# Patient Record
Sex: Male | Born: 1950
Health system: Southern US, Community
[De-identification: ages and names within clinical notes are randomized; demographics above are authoritative.]

## PROBLEM LIST (undated history)

## (undated) DIAGNOSIS — W11XXXA Fall on and from ladder, initial encounter: Secondary | ICD-10-CM

## (undated) DIAGNOSIS — I10 Essential (primary) hypertension: Secondary | ICD-10-CM

## (undated) HISTORY — DX: Fall on and from ladder, initial encounter: W11.XXXA

---

## 1999-07-24 ENCOUNTER — Emergency Department (HOSPITAL_COMMUNITY): Admission: EM | Admit: 1999-07-24 | Discharge: 1999-07-24 | Payer: Self-pay | Admitting: Emergency Medicine

## 2005-12-25 ENCOUNTER — Ambulatory Visit: Payer: Self-pay | Admitting: Internal Medicine

## 2011-05-21 ENCOUNTER — Other Ambulatory Visit (HOSPITAL_COMMUNITY): Payer: Self-pay

## 2011-05-21 ENCOUNTER — Emergency Department (HOSPITAL_COMMUNITY): Payer: Worker's Compensation

## 2011-05-21 ENCOUNTER — Other Ambulatory Visit: Payer: Self-pay

## 2011-05-21 ENCOUNTER — Encounter (HOSPITAL_COMMUNITY): Payer: Self-pay | Admitting: *Deleted

## 2011-05-21 ENCOUNTER — Inpatient Hospital Stay (HOSPITAL_COMMUNITY)
Admission: EM | Admit: 2011-05-21 | Discharge: 2011-05-23 | DRG: 982 | Disposition: A | Discharge status: Home / Self Care | Payer: Worker's Compensation | Attending: General Surgery | Admitting: General Surgery

## 2011-05-21 DIAGNOSIS — S0181XA Laceration without foreign body of other part of head, initial encounter: Secondary | ICD-10-CM | POA: Diagnosis present

## 2011-05-21 DIAGNOSIS — S0219XA Other fracture of base of skull, initial encounter for closed fracture: Secondary | ICD-10-CM

## 2011-05-21 DIAGNOSIS — S022XXA Fracture of nasal bones, initial encounter for closed fracture: Secondary | ICD-10-CM | POA: Diagnosis present

## 2011-05-21 DIAGNOSIS — D62 Acute posthemorrhagic anemia: Secondary | ICD-10-CM | POA: Diagnosis present

## 2011-05-21 DIAGNOSIS — W19XXXA Unspecified fall, initial encounter: Secondary | ICD-10-CM

## 2011-05-21 DIAGNOSIS — S0280XA Fracture of other specified skull and facial bones, unspecified side, initial encounter for closed fracture: Secondary | ICD-10-CM | POA: Diagnosis present

## 2011-05-21 DIAGNOSIS — S020XXA Fracture of vault of skull, initial encounter for closed fracture: Secondary | ICD-10-CM | POA: Diagnosis present

## 2011-05-21 DIAGNOSIS — Y998 Other external cause status: Secondary | ICD-10-CM

## 2011-05-21 DIAGNOSIS — S065X9A Traumatic subdural hemorrhage with loss of consciousness of unspecified duration, initial encounter: Secondary | ICD-10-CM | POA: Diagnosis present

## 2011-05-21 DIAGNOSIS — S020XXB Fracture of vault of skull, initial encounter for open fracture: Principal | ICD-10-CM | POA: Diagnosis present

## 2011-05-21 DIAGNOSIS — S0285XA Fracture of orbit, unspecified, initial encounter for closed fracture: Secondary | ICD-10-CM | POA: Diagnosis present

## 2011-05-21 DIAGNOSIS — S52599A Other fractures of lower end of unspecified radius, initial encounter for closed fracture: Secondary | ICD-10-CM | POA: Diagnosis present

## 2011-05-21 DIAGNOSIS — W1789XA Other fall from one level to another, initial encounter: Secondary | ICD-10-CM | POA: Diagnosis present

## 2011-05-21 DIAGNOSIS — S52501A Unspecified fracture of the lower end of right radius, initial encounter for closed fracture: Secondary | ICD-10-CM | POA: Diagnosis present

## 2011-05-21 DIAGNOSIS — S0005XA Superficial foreign body of scalp, initial encounter: Secondary | ICD-10-CM | POA: Diagnosis present

## 2011-05-21 DIAGNOSIS — S0085XA Superficial foreign body of other part of head, initial encounter: Secondary | ICD-10-CM | POA: Diagnosis present

## 2011-05-21 DIAGNOSIS — I6200 Nontraumatic subdural hemorrhage, unspecified: Secondary | ICD-10-CM

## 2011-05-21 DIAGNOSIS — S01501A Unspecified open wound of lip, initial encounter: Secondary | ICD-10-CM | POA: Diagnosis present

## 2011-05-21 DIAGNOSIS — S0180XA Unspecified open wound of other part of head, initial encounter: Secondary | ICD-10-CM | POA: Diagnosis present

## 2011-05-21 DIAGNOSIS — S02839A Fracture of medial orbital wall, unspecified side, initial encounter for closed fracture: Secondary | ICD-10-CM

## 2011-05-21 DIAGNOSIS — W11XXXA Fall on and from ladder, initial encounter: Secondary | ICD-10-CM | POA: Diagnosis present

## 2011-05-21 HISTORY — DX: Essential (primary) hypertension: I10

## 2011-05-21 LAB — BASIC METABOLIC PANEL
BUN: 22 mg/dL (ref 6–23)
CO2: 28 mEq/L (ref 19–32)
Calcium: 9.2 mg/dL (ref 8.4–10.5)
Chloride: 100 mEq/L (ref 96–112)
Creatinine, Ser: 0.99 mg/dL (ref 0.50–1.35)
GFR calc Af Amer: >90 mL/min (ref >90)
GFR calc non Af Amer: 87 mL/min — ABNORMAL LOW (ref >90)
Glucose, Bld: 135 mg/dL — ABNORMAL HIGH (ref 70–99)
Potassium: 3.7 mEq/L (ref 3.5–5.1)
Sodium: 137 mEq/L (ref 135–145)

## 2011-05-21 LAB — ETHANOL: Alcohol, Ethyl (B): <11 mg/dL (ref 0–11)

## 2011-05-21 LAB — CBC
HCT: 42.1 % (ref 39.0–52.0)
Hemoglobin: 14.4 g/dL (ref 13.0–17.0)
MCH: 30.3 pg (ref 26.0–34.0)
MCHC: 34.2 g/dL (ref 30.0–36.0)
MCV: 88.4 fL (ref 78.0–100.0)
Platelets: 292 10*3/uL (ref 150–400)
RBC: 4.76 MIL/uL (ref 4.22–5.81)
RDW: 13.3 % (ref 11.5–15.5)
WBC: 6.4 10*3/uL (ref 4.0–10.5)

## 2011-05-21 LAB — PROTIME-INR
INR: 0.99 (ref 0.00–1.49)
Prothrombin Time: 13.3 seconds (ref 11.6–15.2)

## 2011-05-21 MED ORDER — PANTOPRAZOLE SODIUM 40 MG PO TBEC
40.0000 mg | DELAYED_RELEASE_TABLET | Freq: Every day | ORAL | Status: DC
  Administered 2011-05-21: 40 mg via ORAL
  Filled 2011-05-21: qty 1

## 2011-05-21 MED ORDER — TETANUS-DIPHTH-ACELL PERTUSSIS 5-2.5-18.5 LF-MCG/0.5 IM SUSP
0.5000 mL | Freq: Once | INTRAMUSCULAR | Status: AC
  Administered 2011-05-21: 0.5 mL via INTRAMUSCULAR
  Filled 2011-05-21: qty 0.5

## 2011-05-21 MED ORDER — ONDANSETRON HCL 4 MG PO TABS: 4.0000 mg | ORAL_TABLET | Freq: Four times a day (QID) | ORAL | Status: DC | PRN

## 2011-05-21 MED ORDER — MORPHINE SULFATE 2 MG/ML IJ SOLN
2.0000 mg | INTRAMUSCULAR | Status: DC | PRN
  Administered 2011-05-22 (×4): 2 mg via INTRAVENOUS
  Filled 2011-05-21 (×4): qty 1

## 2011-05-21 MED ORDER — PANTOPRAZOLE SODIUM 40 MG IV SOLR
40.0000 mg | Freq: Every day | INTRAVENOUS | Status: DC
  Filled 2011-05-21 (×2): qty 40

## 2011-05-21 MED ORDER — BACITRACIN-NEOMYCIN-POLYMYXIN OINTMENT TUBE
TOPICAL_OINTMENT | Freq: Every day | CUTANEOUS | Status: DC
  Administered 2011-05-21 – 2011-05-23 (×3): via TOPICAL
  Filled 2011-05-21: qty 15
  Filled 2011-05-21: qty 1

## 2011-05-21 MED ORDER — CEFAZOLIN SODIUM 1-5 GM-% IV SOLN
1.0000 g | Freq: Three times a day (TID) | INTRAVENOUS | Status: DC
  Administered 2011-05-21 – 2011-05-22 (×3): 1 g via INTRAVENOUS
  Filled 2011-05-21 (×8): qty 50

## 2011-05-21 MED ORDER — CEFAZOLIN SODIUM-DEXTROSE 2-3 GM-% IV SOLR
2.0000 g | Freq: Once | INTRAVENOUS | Status: AC
  Administered 2011-05-21: 2 g via INTRAVENOUS
  Filled 2011-05-21: qty 50

## 2011-05-21 MED ORDER — ACETAMINOPHEN 325 MG PO TABS
650.0000 mg | ORAL_TABLET | ORAL | Status: DC | PRN
  Administered 2011-05-21 – 2011-05-22 (×2): 650 mg via ORAL
  Filled 2011-05-21 (×2): qty 2

## 2011-05-21 MED ORDER — KCL IN DEXTROSE-NACL 20-5-0.9 MEQ/L-%-% IV SOLN
INTRAVENOUS | Status: DC
  Administered 2011-05-21 – 2011-05-22 (×3): via INTRAVENOUS
  Filled 2011-05-21 (×5): qty 1000

## 2011-05-21 MED ORDER — MORPHINE SULFATE 4 MG/ML IJ SOLN
4.0000 mg | Freq: Once | INTRAMUSCULAR | Status: AC
  Administered 2011-05-21: 4 mg via INTRAVENOUS

## 2011-05-21 MED ORDER — MORPHINE SULFATE 4 MG/ML IJ SOLN
INTRAMUSCULAR | Status: AC
  Administered 2011-05-21: 4 mg via INTRAVENOUS
  Filled 2011-05-21: qty 1

## 2011-05-21 MED ORDER — TETANUS-DIPHTH-ACELL PERTUSSIS 5-2-15.5 LF-MCG/0.5 IM SUSP: 0.5000 mL | Freq: Once | INTRAMUSCULAR | Status: DC

## 2011-05-21 MED ORDER — SALINE SPRAY 0.65 % NA SOLN
4.0000 | Freq: Four times a day (QID) | NASAL | Status: DC
  Administered 2011-05-21 – 2011-05-23 (×4): 4 via NASAL
  Filled 2011-05-21 (×2): qty 44

## 2011-05-21 MED ORDER — ONDANSETRON HCL 4 MG/2ML IJ SOLN
4.0000 mg | Freq: Four times a day (QID) | INTRAMUSCULAR | Status: DC | PRN
  Administered 2011-05-22: 4 mg via INTRAVENOUS
  Filled 2011-05-21: qty 2

## 2011-05-21 NOTE — ED Notes (Signed)
Returned from CT.

## 2011-05-21 NOTE — ED Provider Notes (Signed)
History     CSN: [id number]  Arrival date & time 05/21/11  1235   First MD Initiated Contact with Patient 05/21/11 1245      Chief Complaint  Patient presents with  . Fall    reports pt was working on top of trailer on 23ft ladder. pt sustained right wrist injury, and lac to forehead.     The history is provided by the patient and the spouse.   the patient reports he was working out on the trailer approximately 13 feet up in the air when he fell off a ladder and reports landed directly on his face injuring his right wrist as well.  He was brought to the ER urgently by his family.  The patient presented to the ER without a cervical collar or immobilized on long spine board.  The patient was initially brought back to the resuscitation bay placed into log roll position and a cervical collar applied.  He denies loss consciousness.  He denies change in his vision.  He denies malocclusion or trismus.  He denies weakness of his upper lower extremities.  He has no numbness or tingling.  He is no abdominal pain nausea or vomiting.  He denies chest pain.  The majority of his pain is in his right wrist which is moderate to severe and worse with range of motion.  He also reports and has evidence of a large laceration to his mid forehead.  He has tenderness of his frontal maxillary sinuses.  His family denies confusion or altered mental status.  He takes 81 mg aspirin but is not otherwise on anticoagulants  Past Medical History  Diagnosis Date  . Hypertension     History reviewed. No pertinent past surgical history.  History reviewed. No pertinent family history.  History  Substance Use Topics  . Smoking status: Never Smoker   . Smokeless tobacco: Not on file  . Alcohol Use: No      Review of Systems  All other systems reviewed and are negative.    Allergies  Review of patient's allergies indicates no known allergies.  Home Medications   Current Outpatient Rx  Name Route Sig Dispense  Refill  . LISINOPRIL 5 MG PO TABS Oral Take 5 mg by mouth daily.      BP 100/58  Pulse 75  Temp(Src) 98.1 F (36.7 C) (Oral)  Resp 16  Ht 5\' 9"  (1.753 m)  Wt 170 lb (77.111 kg)  BMI 25.10 kg/m2  SpO2 96%  Physical Exam  Nursing note and vitals reviewed. Constitutional: He is oriented to person, place, and time. He appears well-developed and well-nourished.  HENT:  Head: Normocephalic.       Large nonbleeding laceration to his anterior forehead.  Patient also has a laceration of his right upper lip with vermilion border involvement.  He also has a laceration of his left upper lip without vermilion border involvement.  He is a laceration of his anterior chin that is nonbleeding at this time.  He is no trismus or malocclusion.  His dentition is normal.  His midface is stable.  He has no active epistaxis at this time.  There is evidence of dried blood in his bilateral nares.  He has swelling and tenderness of his nose.  His tenderness of his anterior face.  His extraocular movements are intact.  There is notable crepitus of his periorbital soft tissues and cheeks.  Eyes: EOM are normal. Pupils are equal, round, and reactive to light.  Neck:  Neck supple.       Mild cervical and paracervical spinal tenderness.  Immobilized in cervical spine collar  Cardiovascular: Normal rate, regular rhythm, normal heart sounds and intact distal pulses.   Pulmonary/Chest: Effort normal and breath sounds normal. No respiratory distress. He exhibits no tenderness.  Abdominal: Soft. He exhibits no distension. There is no tenderness. There is no rebound and no guarding.  Musculoskeletal: He exhibits no edema.       Patient with tenderness and swelling about his right lateral wrist.  He has a normal right radial pulse.  He has normal grip strength and sensation distally.  He has full range of motion of his bilateral hips ankles and knees.  He has full range of motion of his bilateral elbows and shoulders.    Neurological: He is alert and oriented to person, place, and time.       Patient has 5 out of 5 strength in his bilateral upper and lower extremity major muscle groups  Skin: Skin is warm and dry.  Psychiatric: He has a normal mood and affect. Judgment normal.    ED Course  FOREIGN BODY REMOVAL Performed by: Lyanne Co Authorized by: Lyanne Co Risks and benefits: risks, benefits and alternatives were discussed Consent given by: patient Required items: required blood products, implants, devices, and special equipment available Patient identity confirmed: arm band Time out: Immediately prior to procedure a "time out" was called to verify the correct patient, procedure, equipment, support staff and site/side marked as required. Intake: forehead laceration. Anesthesia: local infiltration Local anesthetic: lidocaine 2% with epinephrine Complexity: simple Objects recovered: gravel Post-procedure assessment: foreign body removed Patient tolerance: Patient tolerated the procedure well with no immediate complications.     Date: 05/21/2011  Rate: 65  Rhythm: normal sinus rhythm  QRS Axis: normal  Intervals: normal  ST/T Wave abnormalities: normal  Conduction Disutrbances: none  Narrative Interpretation:   Old EKG Reviewed: No significant changes noted  LACERATION REPAIR Performed by: Lyanne Co Consent: Verbal consent obtained. Risks and benefits: risks, benefits and alternatives were discussed Patient identity confirmed: provided demographic data Time out performed prior to procedure Prepped and Draped in normal sterile fashion Wound explored Laceration Location: left forehead Laceration Length: 6 cm No Foreign Bodies seen or palpated Anesthesia: local infiltration Local anesthetic: lidocaine 2 % with epinephrine Anesthetic total: 6 ml Irrigation method: syringe Amount of cleaning: standard Skin closure: LAYERED CLOSURE 5-0 vicryl 5-0 prolene Number of  sutures or staples: 5 deep, 12 superficial (running interlocked) Technique: running interlocked in interrupted Patient tolerance: Patient tolerated the procedure well with no immediate complications.  LACERATION REPAIR Performed by: Lyanne Co Consent: Verbal consent obtained. Risks and benefits: risks, benefits and alternatives were discussed Patient identity confirmed: provided demographic data Time out performed prior to procedure Prepped and Draped in normal sterile fashion Wound explored Laceration Location: right upper lip (VERMILLION BORDER INVOLVEMENT) Laceration Length: 2cm No Foreign Bodies seen or palpated Anesthesia: local infiltration Local anesthetic: lidocaine 2% with epinephrine Anesthetic total: 3 ml Irrigation method: syringe Amount of cleaning: standard Skin closure: 6-0 chromic and 5-0 prolene Number of sutures or staples: 5 Technique: simple interrupted Patient tolerance: Patient tolerated the procedure well with no immediate complications.   LACERATION REPAIR Performed by: Lyanne Co Consent: Verbal consent obtained. Risks and benefits: risks, benefits and alternatives were discussed Patient identity confirmed: provided demographic data Time out performed prior to procedure Prepped and Draped in normal sterile fashion Wound explored Laceration Location: left upper lip  without vermillion border involvement Laceration Length: 2cm No Foreign Bodies seen or palpated Anesthesia: local infiltration Local anesthetic: lidocaine 2% with epinephrine Anesthetic total: 2 ml Irrigation method: syringe Amount of cleaning: standard Skin closure: 5-0 prolene Number of sutures or staples: 3 Technique: simple interrupted Patient tolerance: Patient tolerated the procedure well with no immediate complications.  LACERATION REPAIR Performed by: Lyanne Co Consent: Verbal consent obtained. Risks and benefits: risks, benefits and alternatives were  discussed Patient identity confirmed: provided demographic data Time out performed prior to procedure Prepped and Draped in normal sterile fashion Wound explored Laceration Location: anterior chin Laceration Length: 3cm No Foreign Bodies seen or palpated Anesthesia: local infiltration Local anesthetic: lidocaine 2% with epinephrine Anesthetic total: 3 ml Irrigation method: syringe Amount of cleaning: standard Skin closure: 5-0 prolene Number of sutures or staples: 4 Technique: simple interrupted Patient tolerance: Patient tolerated the procedure well with no immediate complications.   Labs Reviewed  BASIC METABOLIC PANEL - Abnormal; Notable for the following:    Glucose, Bld 135 (*)    GFR calc non Af Amer 87 (*)    All other components within normal limits  CBC  ETHANOL  PROTIME-INR   Dg Chest 1 View  05/21/2011  *RADIOLOGY REPORT*  Clinical Data: Fall  CHEST - 1 VIEW  Comparison: None.  Findings: The heart size and mediastinal contours are within normal limits.  Both lungs are clear.  The visualized skeletal structures are unremarkable.  IMPRESSION: Negative exam.  Original Report Authenticated By: Rosealee Albee, M.D.   Dg Wrist Complete Right  05/21/2011  *RADIOLOGY REPORT*  Clinical Data: Fall  RIGHT WRIST - COMPLETE 3+ VIEW  Comparison: None.  Findings:  There is a comminuted, intra-articular fracture deformity involving the distal right radius.  There is dorsal angulation of the distal fracture fragments.  There is no radio-opaque foreign body or soft tissue calcifications.  IMPRESSION:  1.  Acute, comminuted, intra-articular fracture of the distal radius.  Per CMS PQRS reporting requirements (PQRS Measure 24): Given the patient's age of greater than 50 and the fracture site (hip, distal radius, or spine), the patient should be tested for osteoporosis using DXA, and the appropriate treatment considered based on the DXA results.  Original Report Authenticated By: Rosealee Albee, M.D.   Ct Head Wo Contrast  05/21/2011  *RADIOLOGY REPORT*  Clinical Data:  Trauma, fall  CT HEAD WITHOUT CONTRAST  Technique: Contiguous axial images were obtained from the base of the skull through the vertex without intravenous contrast.  Comparison:   None.  Findings:  There is bilateral periorbital and intraorbital medial air.  There is a comminuted fracture of the nasal bones. Fracture of the left frontal sinus anterior and posterior wall.  Fluid is noted in left frontal sinus.  There is bilateral nondisplaced fracture of the medial orbital wall lamina papyracea.  In axial image 17 there is nondisplaced fracture of the roof of the right orbital wall.  There probable nondisplaced fracture of the roof of the left orbital wall.  In axial image 16 there is probable nondisplaced fracture of the right sphenoid wing in posterior aspect of the right orbit. Fluid and air are noted in the left maxillary sinus.  Fluid noted bilateral ethmoid sinus.  Probable nondisplaced fracture posterior aspect of the ethmoid sinus on the right.  There is scalp soft tissue defect and small amount of  high density material probable foreign body midline anterior frontal region. There is nondisplaced fracture of the midline frontal  bone.  Small amount of subdural hemorrhage noted in axial image 13 along the interhemispheric fissure.  No mass effect or midline shift.  No hydrocephalus.  The gray and white matter differentiation is preserved.  IMPRESSION: There is bilateral periorbital and intraorbital medial air.  There is a comminuted fracture of the nasal bones. Fracture of the left frontal sinus anterior and posterior wall.  Fluid is noted in left frontal sinus.  There is bilateral nondisplaced fracture of the medial orbital wall lamina papyracea.  In axial image 17 there is nondisplaced fracture of the roof of the right orbital wall.  There probable nondisplaced fracture of the roof of the left orbital wall.  In axial image  16 there is probable nondisplaced fracture of the right sphenoid wing in posterior aspect of the right orbit. Fluid and air are noted in the left maxillary sinus.  Fluid noted bilateral ethmoid sinus.  Probable nondisplaced fracture posterior aspect of the ethmoid sinus on the right.  There is scalp soft tissue defect and small amount of  high density material probable foreign body midline anterior frontal region. There is nondisplaced fracture of the midline frontal bone.  Small amount of subdural hemorrhage noted in axial image 13 along the interhemispheric fissure.  No mass effect or midline shift.  CT cervical spine without IV contrast findings:  Axial images of the cervical spine shows no acute fracture or subluxation.  There is no pneumothorax in visualized lung apices.  Computer processed images shows mild degenerative changes C1-C2 articulation.  There is disc space flattening with mild posterior spurring at C3-C4 and C4-C5 level.  Disc space flattening with mild anterior and mild posterior spurring noted at C5-C6 level.  There is disc space flattening with mild anterior and mild posterior spurring at C 71 level.  No prevertebral soft tissue swelling. Cervical airway is patent.  Coronal images shows no acute fracture or subluxation.  Impression: 1.  No acute fracture or subluxation. 2.  Degenerative changes as described above.  I discussed with Dr. Patria Mane.  Original Report Authenticated By: Natasha Mead, M.D.   Ct Cervical Spine Wo Contrast  05/21/2011  *RADIOLOGY REPORT*  Clinical Data:  Trauma, fall  CT HEAD WITHOUT CONTRAST  Technique: Contiguous axial images were obtained from the base of the skull through the vertex without intravenous contrast.  Comparison:   None.  Findings:  There is bilateral periorbital and intraorbital medial air.  There is a comminuted fracture of the nasal bones. Fracture of the left frontal sinus anterior and posterior wall.  Fluid is noted in left frontal sinus.  There is  bilateral nondisplaced fracture of the medial orbital wall lamina papyracea.  In axial image 17 there is nondisplaced fracture of the roof of the right orbital wall.  There probable nondisplaced fracture of the roof of the left orbital wall.  In axial image 16 there is probable nondisplaced fracture of the right sphenoid wing in posterior aspect of the right orbit. Fluid and air are noted in the left maxillary sinus.  Fluid noted bilateral ethmoid sinus.  Probable nondisplaced fracture posterior aspect of the ethmoid sinus on the right.  There is scalp soft tissue defect and small amount of  high density material probable foreign body midline anterior frontal region. There is nondisplaced fracture of the midline frontal bone.  Small amount of subdural hemorrhage noted in axial image 13 along the interhemispheric fissure.  No mass effect or midline shift.  No hydrocephalus.  The gray and white matter  differentiation is preserved.  IMPRESSION: There is bilateral periorbital and intraorbital medial air.  There is a comminuted fracture of the nasal bones. Fracture of the left frontal sinus anterior and posterior wall.  Fluid is noted in left frontal sinus.  There is bilateral nondisplaced fracture of the medial orbital wall lamina papyracea.  In axial image 17 there is nondisplaced fracture of the roof of the right orbital wall.  There probable nondisplaced fracture of the roof of the left orbital wall.  In axial image 16 there is probable nondisplaced fracture of the right sphenoid wing in posterior aspect of the right orbit. Fluid and air are noted in the left maxillary sinus.  Fluid noted bilateral ethmoid sinus.  Probable nondisplaced fracture posterior aspect of the ethmoid sinus on the right.  There is scalp soft tissue defect and small amount of  high density material probable foreign body midline anterior frontal region. There is nondisplaced fracture of the midline frontal bone.  Small amount of subdural  hemorrhage noted in axial image 13 along the interhemispheric fissure.  No mass effect or midline shift.  CT cervical spine without IV contrast findings:  Axial images of the cervical spine shows no acute fracture or subluxation.  There is no pneumothorax in visualized lung apices.  Computer processed images shows mild degenerative changes C1-C2 articulation.  There is disc space flattening with mild posterior spurring at C3-C4 and C4-C5 level.  Disc space flattening with mild anterior and mild posterior spurring noted at C5-C6 level.  There is disc space flattening with mild anterior and mild posterior spurring at C 71 level.  No prevertebral soft tissue swelling. Cervical airway is patent.  Coronal images shows no acute fracture or subluxation.  Impression: 1.  No acute fracture or subluxation. 2.  Degenerative changes as described above.  I discussed with Dr. Patria Mane.  Original Report Authenticated By: Natasha Mead, M.D.   Ct Maxillofacial Wo Cm  05/21/2011  *RADIOLOGY REPORT*  Clinical Data: Fall.  Forehead laceration.  CT MAXILLOFACIAL WITHOUT CONTRAST  Technique:  Multidetector CT imaging of the maxillofacial structures was performed. Multiplanar CT image reconstructions were also generated.  Comparison: Head CT earlier today.  Findings: Frontal bone fracture is noted through the anterior and posterior wall of the left frontal sinus.  Blood/fluid noted within the left frontal sinus.  The soft tissue/scalp laceration is noted in the overlying soft tissues which extends to the bone surface. Multiple small radiopaque foreign bodies within this laceration. Fractures through the medial orbital walls bilaterally.  The right medial orbital wall fracture is far posterior extensive intraorbital and periorbital air.  Extensive bilateral displaced nasal bone fractures.  No visible orbital floor fractures.  Suspect a fracture through the roof of the right orbit.  Zygomatic arches are intact.  Mandible is intact.  Blood is  noted throughout the paranasal sinuses.  Globes appear intact.  IMPRESSION: Fracture through the anterior and posterior walls of the left frontal sinus.  Large soft tissue laceration extends to the bone overlying this fracture.  Small radiopaque foreign bodies within the soft tissues.  Fracture through the medial orbital walls bilaterally.  The right medial orbital wall fracture is far posterior.  Extensive intra orbital and periorbital air.  Suspect fracture through the roof of the right orbit, difficult to visualize.  Orbital floors and lateral orbital walls appear intact.  Extensive bilateral displaced nasal bone fractures.  Original Report Authenticated By: Cyndie Chime, M.D.   I personally reviewed his CT scan and  discussed the findings with the radiologist  1. Subdural bleeding   2. Medial orbital wall fracture   3. Frontal sinus fracture   4. Fracture of radius, distal, right, closed   5. Fall       MDM  The patient's abdomen is benign on exam.  Chest x-ray is clear.  His multiple facial fractures and a small subdural.  He has a right distal radius fracture.  The patient will be transferred to the trauma service over at [facility] to the neuro ICU for observation overnight please see following consultants below  ENT- Dr Annalee Genta (he personally reviewed the CT scan and will evaluate in the hospital) Ortho- Dr Lajoyce Corners (he'll see the patient in the hospital.  Requests sugar tong) Trauma- Dr Janee Morn (I discussed the findings with Dr. Willene Hatchet who accepts the patient in transfer to [facility] to the neuro ICU) Neurosurgery - Dr Jeral Fruit (I discussed the findings with Dr. Jeral Fruit who personally reviewed his CT scan and will evaluate in the hospital)  The patient and family have been informed  4:53 PM Lacerations repaired.  The patient continues to do well.  Neurologically he is a GCS of 15.  He is still waiting his ICU bed at [facility] come for transfer.          Lyanne Co,  MD 05/21/11 [phone]

## 2011-05-21 NOTE — ED Notes (Signed)
Ortho tech contacted to apply splint to right arm.

## 2011-05-21 NOTE — H&P (Signed)
James Harmon is an 61 y.o. male.   Chief Complaint:   Multitrauma HPI: He was up on a latter 13 feet and fell onto his face and right hand. He was taken by private vehicle the was a long hospital where he was evaluated. He was found to have multiple facial fractures and facial lacerations, a small amount of subdermal blood, and a right wrist fracture. He denies loss of consciousness. He denies neck pain. He denies chest pain or shortness of breath. He denies abdominal pain or back pain. He reports right wrist pain and some facial pain.  Past Medical History  Diagnosis Date  . Hypertension     History reviewed. No pertinent past surgical history.  History reviewed. No pertinent family history. Social History:  reports that he has never smoked. He does not have any smokeless tobacco history on file. He reports that he does not drink alcohol or use illicit drugs.  Allergies: No Known Allergies  Medications Prior to Admission  Medication Dose Route Frequency Provider Last Rate Last Dose  . ceFAZolin (ANCEF) IVPB 2 g/50 mL premix  2 g Intravenous Once Lyanne Co, MD   2 g at 05/21/11 1542  . dextrose 5 % and 0.9 % NaCl with KCl 20 mEq/L infusion   Intravenous Continuous Adolph Pollack, MD      . morphine 2 MG/ML injection 2-6 mg  2-6 mg Intravenous Q1H PRN Adolph Pollack, MD      . morphine 4 MG/ML injection 4 mg  4 mg Intravenous Once Lyanne Co, MD   4 mg at 05/21/11 1300  . neomycin-bacitracin-polymyxin (NEOSPORIN) ointment   Topical Daily Adolph Pollack, MD      . ondansetron [facility]) tablet 4 mg  4 mg Oral Q6H PRN Adolph Pollack, MD       Or  . ondansetron [facility]) injection 4 mg  4 mg Intravenous Q6H PRN Adolph Pollack, MD      . pantoprazole (PROTONIX) EC tablet 40 mg  40 mg Oral Q1200 Adolph Pollack, MD       Or  . pantoprazole (PROTONIX) injection 40 mg  40 mg Intravenous Q1200 Adolph Pollack, MD      . [facility]) injection 0.5 mL  0.5 mL Intramuscular  Once Lyanne Co, MD   0.5 mL at 05/21/11 1423  . DISCONTD: TDaP (ADACEL) injection 0.5 mL  0.5 mL Intramuscular Once Lyanne Co, MD       No current outpatient prescriptions on file as of 05/21/2011.   Prior to Admission medications   Medication Sig Start Date End Date Taking? Authorizing Provider  lisinopril (PRINIVIL,ZESTRIL) 5 MG tablet Take 5 mg by mouth daily.   Yes Historical Provider, MD    Results for orders placed during the hospital encounter of 05/21/11 (from the past 48 hour(s))  CBC     Status: Normal   Collection Time   05/21/11 12:47 PM      Component Value Range Comment   WBC 6.4  4.0 - 10.5 (K/uL)    RBC 4.76  4.22 - 5.81 (MIL/uL)    Hemoglobin 14.4  13.0 - 17.0 (g/dL)    HCT 29.5  62.1 - 30.8 (%)    MCV 88.4  78.0 - 100.0 (fL)    MCH 30.3  26.0 - 34.0 (pg)    MCHC 34.2  30.0 - 36.0 (g/dL)    RDW 65.7  84.6 - 96.2 (%)  Platelets 292  150 - 400 (K/uL)   ETHANOL     Status: Normal   Collection Time   05/21/11 12:47 PM      Component Value Range Comment   Alcohol, Ethyl (B) <11  0 - 11 (mg/dL)   BASIC METABOLIC PANEL     Status: Abnormal   Collection Time   05/21/11 12:47 PM      Component Value Range Comment   Sodium 137  135 - 145 (mEq/L)    Potassium 3.7  3.5 - 5.1 (mEq/L)    Chloride 100  96 - 112 (mEq/L)    CO2 28  19 - 32 (mEq/L)    Glucose, Bld 135 (*) 70 - 99 (mg/dL)    BUN 22  6 - 23 (mg/dL)    Creatinine, Ser 4.09  0.50 - 1.35 (mg/dL)    Calcium 9.2  8.4 - 10.5 (mg/dL)    GFR calc non Af Amer 87 (*) >90 (mL/min)    GFR calc Af Amer >90  >90 (mL/min)   PROTIME-INR     Status: Normal   Collection Time   05/21/11 12:47 PM      Component Value Range Comment   Prothrombin Time 13.3  11.6 - 15.2 (seconds)    INR 0.99  0.00 - 1.49     Dg Chest 1 View  05/21/2011  *RADIOLOGY REPORT*  Clinical Data: Fall  CHEST - 1 VIEW  Comparison: None.  Findings: The heart size and mediastinal contours are within normal limits.  Both lungs are clear.  The  visualized skeletal structures are unremarkable.  IMPRESSION: Negative exam.  Original Report Authenticated By: Rosealee Albee, M.D.   Dg Wrist Complete Right  05/21/2011  *RADIOLOGY REPORT*  Clinical Data: Fall  RIGHT WRIST - COMPLETE 3+ VIEW  Comparison: None.  Findings:  There is a comminuted, intra-articular fracture deformity involving the distal right radius.  There is dorsal angulation of the distal fracture fragments.  There is no radio-opaque foreign body or soft tissue calcifications.  IMPRESSION:  1.  Acute, comminuted, intra-articular fracture of the distal radius.  Per CMS PQRS reporting requirements (PQRS Measure 24): Given the patient's age of greater than 50 and the fracture site (hip, distal radius, or spine), the patient should be tested for osteoporosis using DXA, and the appropriate treatment considered based on the DXA results.  Original Report Authenticated By: Rosealee Albee, M.D.   Ct Head Wo Contrast  05/21/2011  *RADIOLOGY REPORT*  Clinical Data:  Trauma, fall  CT HEAD WITHOUT CONTRAST  Technique: Contiguous axial images were obtained from the base of the skull through the vertex without intravenous contrast.  Comparison:   None.  Findings:  There is bilateral periorbital and intraorbital medial air.  There is a comminuted fracture of the nasal bones. Fracture of the left frontal sinus anterior and posterior wall.  Fluid is noted in left frontal sinus.  There is bilateral nondisplaced fracture of the medial orbital wall lamina papyracea.  In axial image 17 there is nondisplaced fracture of the roof of the right orbital wall.  There probable nondisplaced fracture of the roof of the left orbital wall.  In axial image 16 there is probable nondisplaced fracture of the right sphenoid wing in posterior aspect of the right orbit. Fluid and air are noted in the left maxillary sinus.  Fluid noted bilateral ethmoid sinus.  Probable nondisplaced fracture posterior aspect of the ethmoid sinus  on the right.  There is scalp soft  tissue defect and small amount of  high density material probable foreign body midline anterior frontal region. There is nondisplaced fracture of the midline frontal bone.  Small amount of subdural hemorrhage noted in axial image 13 along the interhemispheric fissure.  No mass effect or midline shift.  No hydrocephalus.  The gray and white matter differentiation is preserved.  IMPRESSION: There is bilateral periorbital and intraorbital medial air.  There is a comminuted fracture of the nasal bones. Fracture of the left frontal sinus anterior and posterior wall.  Fluid is noted in left frontal sinus.  There is bilateral nondisplaced fracture of the medial orbital wall lamina papyracea.  In axial image 17 there is nondisplaced fracture of the roof of the right orbital wall.  There probable nondisplaced fracture of the roof of the left orbital wall.  In axial image 16 there is probable nondisplaced fracture of the right sphenoid wing in posterior aspect of the right orbit. Fluid and air are noted in the left maxillary sinus.  Fluid noted bilateral ethmoid sinus.  Probable nondisplaced fracture posterior aspect of the ethmoid sinus on the right.  There is scalp soft tissue defect and small amount of  high density material probable foreign body midline anterior frontal region. There is nondisplaced fracture of the midline frontal bone.  Small amount of subdural hemorrhage noted in axial image 13 along the interhemispheric fissure.  No mass effect or midline shift.  CT cervical spine without IV contrast findings:  Axial images of the cervical spine shows no acute fracture or subluxation.  There is no pneumothorax in visualized lung apices.  Computer processed images shows mild degenerative changes C1-C2 articulation.  There is disc space flattening with mild posterior spurring at C3-C4 and C4-C5 level.  Disc space flattening with mild anterior and mild posterior spurring noted at C5-C6  level.  There is disc space flattening with mild anterior and mild posterior spurring at C 71 level.  No prevertebral soft tissue swelling. Cervical airway is patent.  Coronal images shows no acute fracture or subluxation.  Impression: 1.  No acute fracture or subluxation. 2.  Degenerative changes as described above.  I discussed with Dr. Patria Mane.  Original Report Authenticated By: Natasha Mead, M.D.   Ct Cervical Spine Wo Contrast  05/21/2011  *RADIOLOGY REPORT*  Clinical Data:  Trauma, fall  CT HEAD WITHOUT CONTRAST  Technique: Contiguous axial images were obtained from the base of the skull through the vertex without intravenous contrast.  Comparison:   None.  Findings:  There is bilateral periorbital and intraorbital medial air.  There is a comminuted fracture of the nasal bones. Fracture of the left frontal sinus anterior and posterior wall.  Fluid is noted in left frontal sinus.  There is bilateral nondisplaced fracture of the medial orbital wall lamina papyracea.  In axial image 17 there is nondisplaced fracture of the roof of the right orbital wall.  There probable nondisplaced fracture of the roof of the left orbital wall.  In axial image 16 there is probable nondisplaced fracture of the right sphenoid wing in posterior aspect of the right orbit. Fluid and air are noted in the left maxillary sinus.  Fluid noted bilateral ethmoid sinus.  Probable nondisplaced fracture posterior aspect of the ethmoid sinus on the right.  There is scalp soft tissue defect and small amount of  high density material probable foreign body midline anterior frontal region. There is nondisplaced fracture of the midline frontal bone.  Small amount of subdural hemorrhage noted  in axial image 13 along the interhemispheric fissure.  No mass effect or midline shift.  No hydrocephalus.  The gray and white matter differentiation is preserved.  IMPRESSION: There is bilateral periorbital and intraorbital medial air.  There is a comminuted  fracture of the nasal bones. Fracture of the left frontal sinus anterior and posterior wall.  Fluid is noted in left frontal sinus.  There is bilateral nondisplaced fracture of the medial orbital wall lamina papyracea.  In axial image 17 there is nondisplaced fracture of the roof of the right orbital wall.  There probable nondisplaced fracture of the roof of the left orbital wall.  In axial image 16 there is probable nondisplaced fracture of the right sphenoid wing in posterior aspect of the right orbit. Fluid and air are noted in the left maxillary sinus.  Fluid noted bilateral ethmoid sinus.  Probable nondisplaced fracture posterior aspect of the ethmoid sinus on the right.  There is scalp soft tissue defect and small amount of  high density material probable foreign body midline anterior frontal region. There is nondisplaced fracture of the midline frontal bone.  Small amount of subdural hemorrhage noted in axial image 13 along the interhemispheric fissure.  No mass effect or midline shift.  CT cervical spine without IV contrast findings:  Axial images of the cervical spine shows no acute fracture or subluxation.  There is no pneumothorax in visualized lung apices.  Computer processed images shows mild degenerative changes C1-C2 articulation.  There is disc space flattening with mild posterior spurring at C3-C4 and C4-C5 level.  Disc space flattening with mild anterior and mild posterior spurring noted at C5-C6 level.  There is disc space flattening with mild anterior and mild posterior spurring at C 71 level.  No prevertebral soft tissue swelling. Cervical airway is patent.  Coronal images shows no acute fracture or subluxation.  Impression: 1.  No acute fracture or subluxation. 2.  Degenerative changes as described above.  I discussed with Dr. Patria Mane.  Original Report Authenticated By: Natasha Mead, M.D.   Ct Maxillofacial Wo Cm  05/21/2011  *RADIOLOGY REPORT*  Clinical Data: Fall.  Forehead laceration.  CT  MAXILLOFACIAL WITHOUT CONTRAST  Technique:  Multidetector CT imaging of the maxillofacial structures was performed. Multiplanar CT image reconstructions were also generated.  Comparison: Head CT earlier today.  Findings: Frontal bone fracture is noted through the anterior and posterior wall of the left frontal sinus.  Blood/fluid noted within the left frontal sinus.  The soft tissue/scalp laceration is noted in the overlying soft tissues which extends to the bone surface. Multiple small radiopaque foreign bodies within this laceration. Fractures through the medial orbital walls bilaterally.  The right medial orbital wall fracture is far posterior extensive intraorbital and periorbital air.  Extensive bilateral displaced nasal bone fractures.  No visible orbital floor fractures.  Suspect a fracture through the roof of the right orbit.  Zygomatic arches are intact.  Mandible is intact.  Blood is noted throughout the paranasal sinuses.  Globes appear intact.  IMPRESSION: Fracture through the anterior and posterior walls of the left frontal sinus.  Large soft tissue laceration extends to the bone overlying this fracture.  Small radiopaque foreign bodies within the soft tissues.  Fracture through the medial orbital walls bilaterally.  The right medial orbital wall fracture is far posterior.  Extensive intra orbital and periorbital air.  Suspect fracture through the roof of the right orbit, difficult to visualize.  Orbital floors and lateral orbital walls appear intact.  Extensive bilateral displaced nasal bone fractures.  Original Report Authenticated By: Cyndie Chime, M.D.    Review of Systems  Constitutional: Negative for fever and weight loss.  HENT: Negative for hearing loss, congestion and neck pain.   Eyes: Negative for blurred vision and double vision.  Respiratory: Negative for shortness of breath and wheezing.   Cardiovascular: Negative for chest pain.  Gastrointestinal: Positive for nausea. Negative  for abdominal pain.  Musculoskeletal: Positive for falls. Negative for back pain.  Neurological: Positive for headaches. Negative for dizziness, focal weakness, seizures and weakness.  Endo/Heme/Allergies: Bruises/bleeds easily.    Blood pressure 146/63, pulse 104, temperature 99.9 F (37.7 C), temperature source Oral, resp. rate 24, height 5\' 9"  (1.753 m), weight 168 lb 14 oz (76.6 kg), SpO2 95.00%. Physical Exam  Constitutional: He is oriented to person, place, and time. He appears well-developed and well-nourished. No distress.  HENT:  Right Ear: External ear normal.  Left Ear: External ear normal.       Bilateral periorbital swelling. Closed lacerations on  forehead, bridge of nose, upper and lower lip areas.  Eyes: EOM are normal. Pupils are equal, round, and reactive to light.  Neck: Normal range of motion. Neck supple. No tracheal deviation present.       No cervical spine tenderness.  Cardiovascular: Normal rate and regular rhythm.   Respiratory: Effort normal and breath sounds normal.       No chest wall tenderness or contusions.  GI: Soft. He exhibits no distension and no mass. There is no tenderness.       Small umbilical hernia. No abdominal wall contusions.  Genitourinary:       No pelvic tenderness or instability.  Musculoskeletal:       Right upper extremity is in a splint. He is able to move the fingers of his right hand. The other extremities have full range of motion and are atraumatic.  Neurological: He is alert and oriented to person, place, and time. No cranial nerve deficit. He exhibits normal muscle tone.  Skin: Skin is warm and dry.  Psychiatric: He has a normal mood and affect. His behavior is normal.     Assessment/Plan 1. Small subdural hematoma-he is neurologically intact.  2. Multiple facial fractures-Frontal bone fracture is open and laceration has been closed.  3. Right wrist fracture.  Plan: Admit to neuro surgical intensive care unit. Frequent  neurologic checks. Consult neurosurgery, ENT, Orthopedic surgery. Sips of clear liquids.  IV Ancef.  Facial wound care. Shaquinta Peruski J 05/21/2011, 6:51 PM

## 2011-05-21 NOTE — Consult Note (Signed)
ENT CONSULT:  Reason for Consult:Facial Trauma  Referring Physician: Trauma Svc  James Harmon is an 61 y.o. male.  HPI: Pt fell form ladder with injuries to face and Rt wrist. No LOC. Nl vision, nl EOMI, no diplopia  Past Medical History  Diagnosis Date  . Hypertension     History reviewed. No pertinent past surgical history.  History reviewed. No pertinent family history.  Social History:  reports that he has never smoked. He does not have any smokeless tobacco history on file. He reports that he does not drink alcohol or use illicit drugs.  Allergies: No Known Allergies  Medications: I have reviewed the patient's current medications.  Results for orders placed during the hospital encounter of 05/21/11 (from the past 48 hour(s))  CBC     Status: Normal   Collection Time   05/21/11 12:47 PM      Component Value Range Comment   WBC 6.4  4.0 - 10.5 (K/uL)    RBC 4.76  4.22 - 5.81 (MIL/uL)    Hemoglobin 14.4  13.0 - 17.0 (g/dL)    HCT 40.9  81.1 - 91.4 (%)    MCV 88.4  78.0 - 100.0 (fL)    MCH 30.3  26.0 - 34.0 (pg)    MCHC 34.2  30.0 - 36.0 (g/dL)    RDW 78.2  95.6 - 21.3 (%)    Platelets 292  150 - 400 (K/uL)   ETHANOL     Status: Normal   Collection Time   05/21/11 12:47 PM      Component Value Range Comment   Alcohol, Ethyl (B) <11  0 - 11 (mg/dL)   BASIC METABOLIC PANEL     Status: Abnormal   Collection Time   05/21/11 12:47 PM      Component Value Range Comment   Sodium 137  135 - 145 (mEq/L)    Potassium 3.7  3.5 - 5.1 (mEq/L)    Chloride 100  96 - 112 (mEq/L)    CO2 28  19 - 32 (mEq/L)    Glucose, Bld 135 (*) 70 - 99 (mg/dL)    BUN 22  6 - 23 (mg/dL)    Creatinine, Ser 0.86  0.50 - 1.35 (mg/dL)    Calcium 9.2  8.4 - 10.5 (mg/dL)    GFR calc non Af Amer 87 (*) >90 (mL/min)    GFR calc Af Amer >90  >90 (mL/min)   PROTIME-INR     Status: Normal   Collection Time   05/21/11 12:47 PM      Component Value Range Comment   Prothrombin Time 13.3  11.6 - 15.2 (seconds)     INR 0.99  0.00 - 1.49      Dg Chest 1 View  05/21/2011  *RADIOLOGY REPORT*  Clinical Data: Fall  CHEST - 1 VIEW  Comparison: None.  Findings: The heart size and mediastinal contours are within normal limits.  Both lungs are clear.  The visualized skeletal structures are unremarkable.  IMPRESSION: Negative exam.  Original Report Authenticated By: Rosealee Albee, M.D.   Dg Wrist Complete Right  05/21/2011  *RADIOLOGY REPORT*  Clinical Data: Fall  RIGHT WRIST - COMPLETE 3+ VIEW  Comparison: None.  Findings:  There is a comminuted, intra-articular fracture deformity involving the distal right radius.  There is dorsal angulation of the distal fracture fragments.  There is no radio-opaque foreign body or soft tissue calcifications.  IMPRESSION:  1.  Acute, comminuted, intra-articular fracture of the distal  radius.  Per CMS PQRS reporting requirements (PQRS Measure 24): Given the patient's age of greater than 50 and the fracture site (hip, distal radius, or spine), the patient should be tested for osteoporosis using DXA, and the appropriate treatment considered based on the DXA results.  Original Report Authenticated By: Rosealee Albee, M.D.   Ct Head Wo Contrast  05/21/2011  *RADIOLOGY REPORT*  Clinical Data:  Trauma, fall  CT HEAD WITHOUT CONTRAST  Technique: Contiguous axial images were obtained from the base of the skull through the vertex without intravenous contrast.  Comparison:   None.  Findings:  There is bilateral periorbital and intraorbital medial air.  There is a comminuted fracture of the nasal bones. Fracture of the left frontal sinus anterior and posterior wall.  Fluid is noted in left frontal sinus.  There is bilateral nondisplaced fracture of the medial orbital wall lamina papyracea.  In axial image 17 there is nondisplaced fracture of the roof of the right orbital wall.  There probable nondisplaced fracture of the roof of the left orbital wall.  In axial image 16 there is probable  nondisplaced fracture of the right sphenoid wing in posterior aspect of the right orbit. Fluid and air are noted in the left maxillary sinus.  Fluid noted bilateral ethmoid sinus.  Probable nondisplaced fracture posterior aspect of the ethmoid sinus on the right.  There is scalp soft tissue defect and small amount of  high density material probable foreign body midline anterior frontal region. There is nondisplaced fracture of the midline frontal bone.  Small amount of subdural hemorrhage noted in axial image 13 along the interhemispheric fissure.  No mass effect or midline shift.  No hydrocephalus.  The gray and white matter differentiation is preserved.  IMPRESSION: There is bilateral periorbital and intraorbital medial air.  There is a comminuted fracture of the nasal bones. Fracture of the left frontal sinus anterior and posterior wall.  Fluid is noted in left frontal sinus.  There is bilateral nondisplaced fracture of the medial orbital wall lamina papyracea.  In axial image 17 there is nondisplaced fracture of the roof of the right orbital wall.  There probable nondisplaced fracture of the roof of the left orbital wall.  In axial image 16 there is probable nondisplaced fracture of the right sphenoid wing in posterior aspect of the right orbit. Fluid and air are noted in the left maxillary sinus.  Fluid noted bilateral ethmoid sinus.  Probable nondisplaced fracture posterior aspect of the ethmoid sinus on the right.  There is scalp soft tissue defect and small amount of  high density material probable foreign body midline anterior frontal region. There is nondisplaced fracture of the midline frontal bone.  Small amount of subdural hemorrhage noted in axial image 13 along the interhemispheric fissure.  No mass effect or midline shift.  CT cervical spine without IV contrast findings:  Axial images of the cervical spine shows no acute fracture or subluxation.  There is no pneumothorax in visualized lung apices.   Computer processed images shows mild degenerative changes C1-C2 articulation.  There is disc space flattening with mild posterior spurring at C3-C4 and C4-C5 level.  Disc space flattening with mild anterior and mild posterior spurring noted at C5-C6 level.  There is disc space flattening with mild anterior and mild posterior spurring at C 71 level.  No prevertebral soft tissue swelling. Cervical airway is patent.  Coronal images shows no acute fracture or subluxation.  Impression: 1.  No acute fracture or  subluxation. 2.  Degenerative changes as described above.  I discussed with Dr. Patria Mane.  Original Report Authenticated By: Natasha Mead, M.D.   Ct Cervical Spine Wo Contrast  05/21/2011  *RADIOLOGY REPORT*  Clinical Data:  Trauma, fall  CT HEAD WITHOUT CONTRAST  Technique: Contiguous axial images were obtained from the base of the skull through the vertex without intravenous contrast.  Comparison:   None.  Findings:  There is bilateral periorbital and intraorbital medial air.  There is a comminuted fracture of the nasal bones. Fracture of the left frontal sinus anterior and posterior wall.  Fluid is noted in left frontal sinus.  There is bilateral nondisplaced fracture of the medial orbital wall lamina papyracea.  In axial image 17 there is nondisplaced fracture of the roof of the right orbital wall.  There probable nondisplaced fracture of the roof of the left orbital wall.  In axial image 16 there is probable nondisplaced fracture of the right sphenoid wing in posterior aspect of the right orbit. Fluid and air are noted in the left maxillary sinus.  Fluid noted bilateral ethmoid sinus.  Probable nondisplaced fracture posterior aspect of the ethmoid sinus on the right.  There is scalp soft tissue defect and small amount of  high density material probable foreign body midline anterior frontal region. There is nondisplaced fracture of the midline frontal bone.  Small amount of subdural hemorrhage noted in axial  image 13 along the interhemispheric fissure.  No mass effect or midline shift.  No hydrocephalus.  The gray and white matter differentiation is preserved.  IMPRESSION: There is bilateral periorbital and intraorbital medial air.  There is a comminuted fracture of the nasal bones. Fracture of the left frontal sinus anterior and posterior wall.  Fluid is noted in left frontal sinus.  There is bilateral nondisplaced fracture of the medial orbital wall lamina papyracea.  In axial image 17 there is nondisplaced fracture of the roof of the right orbital wall.  There probable nondisplaced fracture of the roof of the left orbital wall.  In axial image 16 there is probable nondisplaced fracture of the right sphenoid wing in posterior aspect of the right orbit. Fluid and air are noted in the left maxillary sinus.  Fluid noted bilateral ethmoid sinus.  Probable nondisplaced fracture posterior aspect of the ethmoid sinus on the right.  There is scalp soft tissue defect and small amount of  high density material probable foreign body midline anterior frontal region. There is nondisplaced fracture of the midline frontal bone.  Small amount of subdural hemorrhage noted in axial image 13 along the interhemispheric fissure.  No mass effect or midline shift.  CT cervical spine without IV contrast findings:  Axial images of the cervical spine shows no acute fracture or subluxation.  There is no pneumothorax in visualized lung apices.  Computer processed images shows mild degenerative changes C1-C2 articulation.  There is disc space flattening with mild posterior spurring at C3-C4 and C4-C5 level.  Disc space flattening with mild anterior and mild posterior spurring noted at C5-C6 level.  There is disc space flattening with mild anterior and mild posterior spurring at C 71 level.  No prevertebral soft tissue swelling. Cervical airway is patent.  Coronal images shows no acute fracture or subluxation.  Impression: 1.  No acute fracture or  subluxation. 2.  Degenerative changes as described above.  I discussed with Dr. Patria Mane.  Original Report Authenticated By: Natasha Mead, M.D.   Ct Maxillofacial Wo Cm  05/21/2011  *RADIOLOGY  REPORT*  Clinical Data: Fall.  Forehead laceration.  CT MAXILLOFACIAL WITHOUT CONTRAST  Technique:  Multidetector CT imaging of the maxillofacial structures was performed. Multiplanar CT image reconstructions were also generated.  Comparison: Head CT earlier today.  Findings: Frontal bone fracture is noted through the anterior and posterior wall of the left frontal sinus.  Blood/fluid noted within the left frontal sinus.  The soft tissue/scalp laceration is noted in the overlying soft tissues which extends to the bone surface. Multiple small radiopaque foreign bodies within this laceration. Fractures through the medial orbital walls bilaterally.  The right medial orbital wall fracture is far posterior extensive intraorbital and periorbital air.  Extensive bilateral displaced nasal bone fractures.  No visible orbital floor fractures.  Suspect a fracture through the roof of the right orbit.  Zygomatic arches are intact.  Mandible is intact.  Blood is noted throughout the paranasal sinuses.  Globes appear intact.  IMPRESSION: Fracture through the anterior and posterior walls of the left frontal sinus.  Large soft tissue laceration extends to the bone overlying this fracture.  Small radiopaque foreign bodies within the soft tissues.  Fracture through the medial orbital walls bilaterally.  The right medial orbital wall fracture is far posterior.  Extensive intra orbital and periorbital air.  Suspect fracture through the roof of the right orbit, difficult to visualize.  Orbital floors and lateral orbital walls appear intact.  Extensive bilateral displaced nasal bone fractures.  Original Report Authenticated By: Cyndie Chime, M.D.      Blood pressure 146/63, pulse 104, temperature 99.9 F (37.7 C), temperature source Oral,  resp. rate 24, height 5\' 9"  (1.753 m), weight 76.6 kg (168 lb 14 oz), SpO2 95.00%.  PHYSICAL EXAM: General appearance - alert, well appearing, and in no distress and oriented to person, place, and time. Moderate periorbital edema, EOMI no diplopia. Min displaced nasal frx, no septal hematoma Closed lacerations  Studies Reviewed:Above Maxfacial CT  Assessment/Plan: Min displaced facial fractures Closed lacerations - stable IV Ancef to PO Augmentin for 10 d Saline nasal spray, elevate HOB, Wound care, NO Nose Blowing F/U outpt 10 - 14 d     James Harmon 05/21/2011, 7:15 PM

## 2011-05-21 NOTE — ED Notes (Signed)
Transported to CT with RN  

## 2011-05-21 NOTE — Consult Note (Signed)
Reason for Consult:chi Referring Physician: trauma  James Harmon is an 61 y.o. male.  GEX:[employer] off a tractor trailer ,landing in his face. No history of loc. Seen at [facility], ct head and spine done and transferred to [facility].   Past Medical History  Diagnosis Date  . Hypertension     History reviewed. No pertinent past surgical history.  History reviewed. No pertinent family history.  Social History:  reports that he has never smoked. He does not have any smokeless tobacco history on file. He reports that he does not drink alcohol or use illicit drugs.  Allergies: No Known Allergies  Medications:see  Trauma note  Results for orders placed during the hospital encounter of 05/21/11 (from the past 48 hour(s))  CBC     Status: Normal   Collection Time   05/21/11 12:47 PM      Component Value Range Comment   WBC 6.4  4.0 - 10.5 (K/uL)    RBC 4.76  4.22 - 5.81 (MIL/uL)    Hemoglobin 14.4  13.0 - 17.0 (g/dL)    HCT 13.2  44.0 - 10.2 (%)    MCV 88.4  78.0 - 100.0 (fL)    MCH 30.3  26.0 - 34.0 (pg)    MCHC 34.2  30.0 - 36.0 (g/dL)    RDW 72.5  36.6 - 44.0 (%)    Platelets 292  150 - 400 (K/uL)   ETHANOL     Status: Normal   Collection Time   05/21/11 12:47 PM      Component Value Range Comment   Alcohol, Ethyl (B) <11  0 - 11 (mg/dL)   BASIC METABOLIC PANEL     Status: Abnormal   Collection Time   05/21/11 12:47 PM      Component Value Range Comment   Sodium 137  135 - 145 (mEq/L)    Potassium 3.7  3.5 - 5.1 (mEq/L)    Chloride 100  96 - 112 (mEq/L)    CO2 28  19 - 32 (mEq/L)    Glucose, Bld 135 (*) 70 - 99 (mg/dL)    BUN 22  6 - 23 (mg/dL)    Creatinine, Ser 3.47  0.50 - 1.35 (mg/dL)    Calcium 9.2  8.4 - 10.5 (mg/dL)    GFR calc non Af Amer 87 (*) >90 (mL/min)    GFR calc Af Amer >90  >90 (mL/min)   PROTIME-INR     Status: Normal   Collection Time   05/21/11 12:47 PM      Component Value Range Comment   Prothrombin Time 13.3  11.6 - 15.2 (seconds)    INR 0.99   0.00 - 1.49      Dg Chest 1 View  05/21/2011  *RADIOLOGY REPORT*  Clinical Data: Fall  CHEST - 1 VIEW  Comparison: None.  Findings: The heart size and mediastinal contours are within normal limits.  Both lungs are clear.  The visualized skeletal structures are unremarkable.  IMPRESSION: Negative exam.  Original Report Authenticated By: Rosealee Albee, M.D.   Dg Wrist Complete Right  05/21/2011  *RADIOLOGY REPORT*  Clinical Data: Fall  RIGHT WRIST - COMPLETE 3+ VIEW  Comparison: None.  Findings:  There is a comminuted, intra-articular fracture deformity involving the distal right radius.  There is dorsal angulation of the distal fracture fragments.  There is no radio-opaque foreign body or soft tissue calcifications.  IMPRESSION:  1.  Acute, comminuted, intra-articular fracture of the distal radius.  Per CMS PQRS reporting requirements (PQRS Measure 24): Given the patient's age of greater than 50 and the fracture site (hip, distal radius, or spine), the patient should be tested for osteoporosis using DXA, and the appropriate treatment considered based on the DXA results.  Original Report Authenticated By: Rosealee Albee, M.D.   Ct Head Wo Contrast  05/21/2011  *RADIOLOGY REPORT*  Clinical Data:  Trauma, fall  CT HEAD WITHOUT CONTRAST  Technique: Contiguous axial images were obtained from the base of the skull through the vertex without intravenous contrast.  Comparison:   None.  Findings:  There is bilateral periorbital and intraorbital medial air.  There is a comminuted fracture of the nasal bones. Fracture of the left frontal sinus anterior and posterior wall.  Fluid is noted in left frontal sinus.  There is bilateral nondisplaced fracture of the medial orbital wall lamina papyracea.  In axial image 17 there is nondisplaced fracture of the roof of the right orbital wall.  There probable nondisplaced fracture of the roof of the left orbital wall.  In axial image 16 there is probable nondisplaced  fracture of the right sphenoid wing in posterior aspect of the right orbit. Fluid and air are noted in the left maxillary sinus.  Fluid noted bilateral ethmoid sinus.  Probable nondisplaced fracture posterior aspect of the ethmoid sinus on the right.  There is scalp soft tissue defect and small amount of  high density material probable foreign body midline anterior frontal region. There is nondisplaced fracture of the midline frontal bone.  Small amount of subdural hemorrhage noted in axial image 13 along the interhemispheric fissure.  No mass effect or midline shift.  No hydrocephalus.  The gray and white matter differentiation is preserved.  IMPRESSION: There is bilateral periorbital and intraorbital medial air.  There is a comminuted fracture of the nasal bones. Fracture of the left frontal sinus anterior and posterior wall.  Fluid is noted in left frontal sinus.  There is bilateral nondisplaced fracture of the medial orbital wall lamina papyracea.  In axial image 17 there is nondisplaced fracture of the roof of the right orbital wall.  There probable nondisplaced fracture of the roof of the left orbital wall.  In axial image 16 there is probable nondisplaced fracture of the right sphenoid wing in posterior aspect of the right orbit. Fluid and air are noted in the left maxillary sinus.  Fluid noted bilateral ethmoid sinus.  Probable nondisplaced fracture posterior aspect of the ethmoid sinus on the right.  There is scalp soft tissue defect and small amount of  high density material probable foreign body midline anterior frontal region. There is nondisplaced fracture of the midline frontal bone.  Small amount of subdural hemorrhage noted in axial image 13 along the interhemispheric fissure.  No mass effect or midline shift.  CT cervical spine without IV contrast findings:  Axial images of the cervical spine shows no acute fracture or subluxation.  There is no pneumothorax in visualized lung apices.  Computer  processed images shows mild degenerative changes C1-C2 articulation.  There is disc space flattening with mild posterior spurring at C3-C4 and C4-C5 level.  Disc space flattening with mild anterior and mild posterior spurring noted at C5-C6 level.  There is disc space flattening with mild anterior and mild posterior spurring at C 71 level.  No prevertebral soft tissue swelling. Cervical airway is patent.  Coronal images shows no acute fracture or subluxation.  Impression: 1.  No acute fracture or subluxation. 2.  Degenerative changes as described above.  I discussed with Dr. Patria Mane.  Original Report Authenticated By: Natasha Mead, M.D.   Ct Cervical Spine Wo Contrast  05/21/2011  *RADIOLOGY REPORT*  Clinical Data:  Trauma, fall  CT HEAD WITHOUT CONTRAST  Technique: Contiguous axial images were obtained from the base of the skull through the vertex without intravenous contrast.  Comparison:   None.  Findings:  There is bilateral periorbital and intraorbital medial air.  There is a comminuted fracture of the nasal bones. Fracture of the left frontal sinus anterior and posterior wall.  Fluid is noted in left frontal sinus.  There is bilateral nondisplaced fracture of the medial orbital wall lamina papyracea.  In axial image 17 there is nondisplaced fracture of the roof of the right orbital wall.  There probable nondisplaced fracture of the roof of the left orbital wall.  In axial image 16 there is probable nondisplaced fracture of the right sphenoid wing in posterior aspect of the right orbit. Fluid and air are noted in the left maxillary sinus.  Fluid noted bilateral ethmoid sinus.  Probable nondisplaced fracture posterior aspect of the ethmoid sinus on the right.  There is scalp soft tissue defect and small amount of  high density material probable foreign body midline anterior frontal region. There is nondisplaced fracture of the midline frontal bone.  Small amount of subdural hemorrhage noted in axial image 13  along the interhemispheric fissure.  No mass effect or midline shift.  No hydrocephalus.  The gray and white matter differentiation is preserved.  IMPRESSION: There is bilateral periorbital and intraorbital medial air.  There is a comminuted fracture of the nasal bones. Fracture of the left frontal sinus anterior and posterior wall.  Fluid is noted in left frontal sinus.  There is bilateral nondisplaced fracture of the medial orbital wall lamina papyracea.  In axial image 17 there is nondisplaced fracture of the roof of the right orbital wall.  There probable nondisplaced fracture of the roof of the left orbital wall.  In axial image 16 there is probable nondisplaced fracture of the right sphenoid wing in posterior aspect of the right orbit. Fluid and air are noted in the left maxillary sinus.  Fluid noted bilateral ethmoid sinus.  Probable nondisplaced fracture posterior aspect of the ethmoid sinus on the right.  There is scalp soft tissue defect and small amount of  high density material probable foreign body midline anterior frontal region. There is nondisplaced fracture of the midline frontal bone.  Small amount of subdural hemorrhage noted in axial image 13 along the interhemispheric fissure.  No mass effect or midline shift.  CT cervical spine without IV contrast findings:  Axial images of the cervical spine shows no acute fracture or subluxation.  There is no pneumothorax in visualized lung apices.  Computer processed images shows mild degenerative changes C1-C2 articulation.  There is disc space flattening with mild posterior spurring at C3-C4 and C4-C5 level.  Disc space flattening with mild anterior and mild posterior spurring noted at C5-C6 level.  There is disc space flattening with mild anterior and mild posterior spurring at C 71 level.  No prevertebral soft tissue swelling. Cervical airway is patent.  Coronal images shows no acute fracture or subluxation.  Impression: 1.  No acute fracture or  subluxation. 2.  Degenerative changes as described above.  I discussed with Dr. Patria Mane.  Original Report Authenticated By: Natasha Mead, M.D.   Ct Maxillofacial Wo Cm  05/21/2011  *RADIOLOGY REPORT*  Clinical  Data: Fall.  Forehead laceration.  CT MAXILLOFACIAL WITHOUT CONTRAST  Technique:  Multidetector CT imaging of the maxillofacial structures was performed. Multiplanar CT image reconstructions were also generated.  Comparison: Head CT earlier today.  Findings: Frontal bone fracture is noted through the anterior and posterior wall of the left frontal sinus.  Blood/fluid noted within the left frontal sinus.  The soft tissue/scalp laceration is noted in the overlying soft tissues which extends to the bone surface. Multiple small radiopaque foreign bodies within this laceration. Fractures through the medial orbital walls bilaterally.  The right medial orbital wall fracture is far posterior extensive intraorbital and periorbital air.  Extensive bilateral displaced nasal bone fractures.  No visible orbital floor fractures.  Suspect a fracture through the roof of the right orbit.  Zygomatic arches are intact.  Mandible is intact.  Blood is noted throughout the paranasal sinuses.  Globes appear intact.  IMPRESSION: Fracture through the anterior and posterior walls of the left frontal sinus.  Large soft tissue laceration extends to the bone overlying this fracture.  Small radiopaque foreign bodies within the soft tissues.  Fracture through the medial orbital walls bilaterally.  The right medial orbital wall fracture is far posterior.  Extensive intra orbital and periorbital air.  Suspect fracture through the roof of the right orbit, difficult to visualize.  Orbital floors and lateral orbital walls appear intact.  Extensive bilateral displaced nasal bone fractures.  Original Report Authenticated By: Cyndie Chime, M.D.    Review of Systems  Constitutional: Negative.   HENT: Positive for neck pain.   Eyes: Positive  for blurred vision.  Respiratory: Negative.   Cardiovascular: Negative.   Gastrointestinal: Negative.   Genitourinary: Negative.   Skin: Negative.   Neurological: Positive for headaches.  Endo/Heme/Allergies: Negative.   Psychiatric/Behavioral: Negative.    Blood pressure 146/63, pulse 104, temperature 99.9 F (37.7 C), temperature source Oral, resp. rate 24, height 5\' 9"  (1.753 m), weight 76.6 kg (168 lb 14 oz), SpO2 95.00%. Physical Examswelling in forehead, nose, orbits. No evidence of csf leak in nose or ears. C/o of facial pain. No trouble breathing. Strenght, nl. dtr nl. Sensory, nl. Ct spine, ddd , no fractures  Ct head showed nasal fractures but there is a fracture of the frontal sinus which compromise the anterior and posterior wall. ent to see him a surgiacal decision will be made with ent .  Assessment/Plan: observation  Betsey Sossamon M 05/21/2011, 7:31 PM

## 2011-05-21 NOTE — ED Notes (Signed)
[facility] at bedside to transport pt to [facility]  

## 2011-05-22 ENCOUNTER — Inpatient Hospital Stay (HOSPITAL_COMMUNITY): Payer: Worker's Compensation | Admitting: Certified Registered Nurse Anesthetist

## 2011-05-22 ENCOUNTER — Encounter (HOSPITAL_COMMUNITY): Payer: Self-pay | Admitting: Certified Registered Nurse Anesthetist

## 2011-05-22 ENCOUNTER — Encounter (HOSPITAL_COMMUNITY): Admission: EM | Disposition: A | Discharge status: Home / Self Care | Payer: Self-pay | Source: Home / Self Care

## 2011-05-22 DIAGNOSIS — S022XXA Fracture of nasal bones, initial encounter for closed fracture: Secondary | ICD-10-CM | POA: Diagnosis present

## 2011-05-22 DIAGNOSIS — S0181XA Laceration without foreign body of other part of head, initial encounter: Secondary | ICD-10-CM | POA: Diagnosis present

## 2011-05-22 DIAGNOSIS — S52501A Unspecified fracture of the lower end of right radius, initial encounter for closed fracture: Secondary | ICD-10-CM | POA: Diagnosis present

## 2011-05-22 DIAGNOSIS — S065X9A Traumatic subdural hemorrhage with loss of consciousness of unspecified duration, initial encounter: Secondary | ICD-10-CM | POA: Diagnosis present

## 2011-05-22 DIAGNOSIS — S0219XA Other fracture of base of skull, initial encounter for closed fracture: Secondary | ICD-10-CM | POA: Diagnosis present

## 2011-05-22 DIAGNOSIS — S0285XA Fracture of orbit, unspecified, initial encounter for closed fracture: Secondary | ICD-10-CM | POA: Diagnosis present

## 2011-05-22 DIAGNOSIS — W11XXXA Fall on and from ladder, initial encounter: Secondary | ICD-10-CM | POA: Diagnosis present

## 2011-05-22 DIAGNOSIS — D62 Acute posthemorrhagic anemia: Secondary | ICD-10-CM | POA: Diagnosis not present

## 2011-05-22 DIAGNOSIS — S020XXA Fracture of vault of skull, initial encounter for closed fracture: Secondary | ICD-10-CM | POA: Diagnosis present

## 2011-05-22 HISTORY — PX: FRACTURE SURGERY: SHX138

## 2011-05-22 LAB — BASIC METABOLIC PANEL
CO2: 24 mEq/L (ref 19–32)
Calcium: 8.3 mg/dL — ABNORMAL LOW (ref 8.4–10.5)
Chloride: 105 mEq/L (ref 96–112)
Glucose, Bld: 141 mg/dL — ABNORMAL HIGH (ref 70–99)
Sodium: 137 mEq/L (ref 135–145)

## 2011-05-22 LAB — CBC
Hemoglobin: 12.7 g/dL — ABNORMAL LOW (ref 13.0–17.0)
MCH: 30.4 pg (ref 26.0–34.0)
MCV: 86.8 fL (ref 78.0–100.0)
Platelets: 258 10*3/uL (ref 150–400)
RBC: 4.18 MIL/uL — ABNORMAL LOW (ref 4.22–5.81)
WBC: 9.7 10*3/uL (ref 4.0–10.5)

## 2011-05-22 SURGERY — OPEN REDUCTION INTERNAL FIXATION (ORIF) DISTAL RADIUS FRACTURE
Anesthesia: General | Site: Wrist | Laterality: Right | Wound class: Clean

## 2011-05-22 MED ORDER — METOCLOPRAMIDE HCL 5 MG/ML IJ SOLN: 5.0000 mg | Freq: Three times a day (TID) | INTRAMUSCULAR | Status: DC | PRN

## 2011-05-22 MED ORDER — PHENYLEPHRINE HCL 10 MG/ML IJ SOLN
INTRAMUSCULAR | Status: DC | PRN
  Administered 2011-05-22 (×4): 80 ug via INTRAVENOUS

## 2011-05-22 MED ORDER — WARFARIN - PHARMACIST DOSING INPATIENT: Freq: Every day | Status: DC

## 2011-05-22 MED ORDER — METOCLOPRAMIDE HCL 10 MG PO TABS: 5.0000 mg | ORAL_TABLET | Freq: Three times a day (TID) | ORAL | Status: DC | PRN

## 2011-05-22 MED ORDER — ONDANSETRON HCL 4 MG PO TABS: 4.0000 mg | ORAL_TABLET | Freq: Four times a day (QID) | ORAL | Status: DC | PRN

## 2011-05-22 MED ORDER — WARFARIN VIDEO
Freq: Once | Status: AC
  Administered 2011-05-22: 22:00:00

## 2011-05-22 MED ORDER — WARFARIN SODIUM 7.5 MG PO TABS
7.5000 mg | ORAL_TABLET | Freq: Once | ORAL | Status: AC
  Administered 2011-05-22: 7.5 mg via ORAL
  Filled 2011-05-22 (×2): qty 1

## 2011-05-22 MED ORDER — PROPOFOL 10 MG/ML IV EMUL
INTRAVENOUS | Status: DC | PRN
  Administered 2011-05-22: 200 mg via INTRAVENOUS

## 2011-05-22 MED ORDER — CEFAZOLIN SODIUM 1-5 GM-% IV SOLN
INTRAVENOUS | Status: DC | PRN
  Administered 2011-05-22: 1 g via INTRAVENOUS

## 2011-05-22 MED ORDER — LACTATED RINGERS IV SOLN
INTRAVENOUS | Status: DC | PRN
  Administered 2011-05-22 (×2): via INTRAVENOUS

## 2011-05-22 MED ORDER — ONDANSETRON HCL 4 MG/2ML IJ SOLN
INTRAMUSCULAR | Status: DC | PRN
  Administered 2011-05-22: 4 mg via INTRAVENOUS

## 2011-05-22 MED ORDER — HYDROMORPHONE HCL PF 1 MG/ML IJ SOLN
0.5000 mg | INTRAMUSCULAR | Status: DC | PRN
  Administered 2011-05-23 (×2): 1 mg via INTRAVENOUS
  Filled 2011-05-22 (×2): qty 1

## 2011-05-22 MED ORDER — MIDAZOLAM HCL 5 MG/ML IJ SOLN
INTRAMUSCULAR | Status: DC | PRN
  Administered 2011-05-22: 2 mg via INTRAVENOUS

## 2011-05-22 MED ORDER — ONDANSETRON HCL 4 MG/2ML IJ SOLN: 4.0000 mg | Freq: Four times a day (QID) | INTRAMUSCULAR | Status: DC | PRN

## 2011-05-22 MED ORDER — WHITE PETROLATUM GEL
Status: AC
  Filled 2011-05-22: qty 5

## 2011-05-22 MED ORDER — CEFAZOLIN SODIUM-DEXTROSE 2-3 GM-% IV SOLR
2.0000 g | Freq: Four times a day (QID) | INTRAVENOUS | Status: AC
  Administered 2011-05-22 – 2011-05-23 (×3): 2 g via INTRAVENOUS
  Filled 2011-05-22 (×3): qty 50

## 2011-05-22 MED ORDER — PATIENT'S GUIDE TO USING COUMADIN BOOK
Freq: Once | Status: AC
  Administered 2011-05-22: 22:00:00
  Filled 2011-05-22: qty 1

## 2011-05-22 MED ORDER — SUFENTANIL CITRATE 50 MCG/ML IV SOLN
INTRAVENOUS | Status: DC | PRN
  Administered 2011-05-22 (×5): 5 ug via INTRAVENOUS

## 2011-05-22 MED ORDER — OXYCODONE-ACETAMINOPHEN 5-325 MG PO TABS
1.0000 | ORAL_TABLET | ORAL | Status: DC | PRN
Start: 2011-05-22 — End: 2011-05-23
  Administered 2011-05-23: 2 via ORAL
  Filled 2011-05-22: qty 2

## 2011-05-22 MED ORDER — ONDANSETRON HCL 4 MG/2ML IJ SOLN
4.0000 mg | Freq: Four times a day (QID) | INTRAMUSCULAR | Status: DC | PRN
Start: 2011-05-22 — End: 2011-05-22

## 2011-05-22 MED ORDER — HYDROMORPHONE HCL PF 1 MG/ML IJ SOLN
0.2500 mg | INTRAMUSCULAR | Status: DC | PRN
  Administered 2011-05-22 (×4): 0.5 mg via INTRAVENOUS

## 2011-05-22 MED ORDER — HYDROMORPHONE HCL PF 1 MG/ML IJ SOLN
INTRAMUSCULAR | Status: AC
  Administered 2011-05-22: 0.5 mg via INTRAVENOUS
  Filled 2011-05-22: qty 1

## 2011-05-22 MED ORDER — WHITE PETROLATUM GEL
Status: AC
  Administered 2011-05-22: 02:00:00
  Filled 2011-05-22: qty 5

## 2011-05-22 MED ORDER — SUCCINYLCHOLINE CHLORIDE 20 MG/ML IJ SOLN
INTRAMUSCULAR | Status: DC | PRN
  Administered 2011-05-22: 100 mg via INTRAVENOUS

## 2011-05-22 MED ORDER — SODIUM CHLORIDE 0.9 % IV SOLN
INTRAVENOUS | Status: DC
  Administered 2011-05-22: 20 mL via INTRAVENOUS

## 2011-05-22 SURGICAL SUPPLY — 54 items
BANDAGE GAUZE ELAST BULKY 4 IN (GAUZE/BANDAGES/DRESSINGS) ×1 IMPLANT
BIT DRILL 2 FAST STEP (BIT) ×1 IMPLANT
BIT DRILL 2.5X4 QC (BIT) ×1 IMPLANT
BNDG CMPR 9X4 STRL LF SNTH (GAUZE/BANDAGES/DRESSINGS) ×1
BNDG COHESIVE 4X5 TAN STRL (GAUZE/BANDAGES/DRESSINGS) ×2 IMPLANT
BNDG ESMARK 4X9 LF (GAUZE/BANDAGES/DRESSINGS) ×2 IMPLANT
CLOTH BEACON ORANGE TIMEOUT ST (SAFETY) ×2 IMPLANT
COVER SURGICAL LIGHT HANDLE (MISCELLANEOUS) ×2 IMPLANT
CUFF TOURNIQUET SINGLE 18IN (TOURNIQUET CUFF) ×1 IMPLANT
CUFF TOURNIQUET SINGLE 24IN (TOURNIQUET CUFF) IMPLANT
DRAPE INCISE IOBAN 66X45 STRL (DRAPES) IMPLANT
DRAPE OEC MINIVIEW 54X84 (DRAPES) ×3 IMPLANT
DRAPE U-SHAPE 47X51 STRL (DRAPES) ×2 IMPLANT
DRSG EMULSION OIL 3X3 NADH (GAUZE/BANDAGES/DRESSINGS) ×1 IMPLANT
DRSG PAD ABDOMINAL 8X10 ST (GAUZE/BANDAGES/DRESSINGS) ×1 IMPLANT
DURAPREP 26ML APPLICATOR (WOUND CARE) ×2 IMPLANT
ELECT REM PT RETURN 9FT ADLT (ELECTROSURGICAL) ×2
ELECTRODE REM PT RTRN 9FT ADLT (ELECTROSURGICAL) ×1 IMPLANT
GAUZE SPONGE 4X4 16PLY XRAY LF (GAUZE/BANDAGES/DRESSINGS) ×2 IMPLANT
GLOVE BIOGEL PI IND STRL 9 (GLOVE) ×1 IMPLANT
GLOVE BIOGEL PI INDICATOR 9 (GLOVE) ×1
GLOVE SURG ORTHO 9.0 STRL STRW (GLOVE) ×2 IMPLANT
GOWN SRG XL XLNG 56XLVL 4 (GOWN DISPOSABLE) ×2 IMPLANT
GOWN STRL NON-REIN XL XLG LVL4 (GOWN DISPOSABLE) ×2
KIT BASIN OR (CUSTOM PROCEDURE TRAY) ×2 IMPLANT
KIT ROOM TURNOVER OR (KITS) ×2 IMPLANT
MANIFOLD NEPTUNE II (INSTRUMENTS) ×2 IMPLANT
NS IRRIG 1000ML POUR BTL (IV SOLUTION) ×2 IMPLANT
PACK ORTHO EXTREMITY (CUSTOM PROCEDURE TRAY) ×2 IMPLANT
PAD ARMBOARD 7.5X6 YLW CONV (MISCELLANEOUS) ×4 IMPLANT
PAD CAST 4YDX4 CTTN HI CHSV (CAST SUPPLIES) IMPLANT
PADDING CAST COTTON 4X4 STRL (CAST SUPPLIES) ×2
PEG FULLY THREADED 2.5X22MM (Peg) ×1 IMPLANT
PEG SUBCHONDRAL SMOOTH 2.0X20 (Peg) ×1 IMPLANT
PLATE WIDE 28.2X62.6 RT (Plate) ×1 IMPLANT
SCREW CORT 3.5X16 LNG (Screw) ×1 IMPLANT
SCREW CORT 3.5X18 LNG (Screw) ×1 IMPLANT
SCREW PEG LOCK 2.5X20 (Peg) ×3 IMPLANT
SCREW PEG LOCK 2.5X24 (Peg) ×1 IMPLANT
SPONGE GAUZE 4X4 12PLY (GAUZE/BANDAGES/DRESSINGS) ×1 IMPLANT
SPONGE LAP 18X18 X RAY DECT (DISPOSABLE) ×4 IMPLANT
STAPLER VISISTAT 35W (STAPLE) ×2 IMPLANT
STRIP CLOSURE SKIN 1/2X4 (GAUZE/BANDAGES/DRESSINGS) ×2 IMPLANT
SUCTION FRAZIER TIP 10 FR DISP (SUCTIONS) ×2 IMPLANT
SUT PROLENE 3 0 PS 1 (SUTURE) ×2 IMPLANT
SUT VIC AB 2-0 CTB1 (SUTURE) IMPLANT
SUT VIC AB 3-0 X1 27 (SUTURE) ×2 IMPLANT
SUT VICRYL 4-0 PS2 18IN ABS (SUTURE) IMPLANT
SYR BULB 3OZ (MISCELLANEOUS) ×2 IMPLANT
TOWEL OR 17X24 6PK STRL BLUE (TOWEL DISPOSABLE) ×2 IMPLANT
TOWEL OR 17X26 10 PK STRL BLUE (TOWEL DISPOSABLE) ×2 IMPLANT
TUBE CONNECTING 12X1/4 (SUCTIONS) ×2 IMPLANT
WATER STERILE IRR 1000ML POUR (IV SOLUTION) ×1 IMPLANT
YANKAUER SUCT BULB TIP NO VENT (SUCTIONS) ×2 IMPLANT

## 2011-05-22 NOTE — Op Note (Signed)
OPERATIVE REPORT  DATE OF SURGERY: 05/22/2011  PATIENT:  James Harmon,  61 y.o. male  PRE-OPERATIVE DIAGNOSIS:  Right wrist fracture  POST-OPERATIVE DIAGNOSIS:  Right wrist fracture  PROCEDURE:  Procedure(s): OPEN REDUCTION INTERNAL FIXATION (ORIF) DISTAL RADIAL FRACTURE  SURGEON:  Surgeon(s): Nadara Mustard, MD  ANESTHESIA:   general  EBL:  Minimal ML  SPECIMEN:  No Specimen  TOURNIQUET:  * Missing tourniquet times found for documented tourniquets in log:  [id number] *  PROCEDURE DETAILS: Patient is a 61 year old gentleman who fell from a ladder and scaffolding sustaining multiple facial fractures lacerations closed head injury as well as a displaced right distal radius fracture. Patient was admitted stabilized and felt to be stable for surgical intervention. Risks and benefits of surgery were discussed including infection neurovascular injury persistent pain need for additional surgery. Patient states he understands and wished to proceed at this time. Description of procedure patient was brought to or room 1 and underwent a general anesthetic. After adequate levels of anesthesia were obtained patient's right upper extremity was first scrubbed using Betadine cleansed then prepped using DuraPrep and draped into a sterile field. An extensile approach of [name] was used this was carried down to the FCR. The sheath was incised the FCR was taken radially and the quadratus was elevated off the distal radius and retracted ulnarly. The fracture was cleansed reduced and a DVR plate was placed this was secured with a 3.5 cortical screw in the gliding plate the plate was positioned under C-arm. The threaded screws were used to lock the plate distally and 3.5 cortical compression screws were used to lock the plate proximally. C-arm fluoroscopy was used to verify reduction in both AP and lateral planes. The wound is irrigated with normal saline. The subcutaneous is closed using a running 0 Vicryl the skin was  closed using approximate staples the wound was covered with Adaptic orthopedic sponges ABDs Kerlix and Coban. Patient was extubated taken to the PACU in stable condition.  PLAN OF CARE: Admit to inpatient   PATIENT DISPOSITION:  PACU - hemodynamically stable.   Nadara Mustard, MD 05/22/2011 6:41 PM

## 2011-05-22 NOTE — Anesthesia Preprocedure Evaluation (Addendum)
Anesthesia Evaluation  Patient identified by MRN, date of birth, ID band Patient awake    Reviewed: Allergy & Precautions, H&P , NPO status , Patient's Chart, lab work & pertinent test results, reviewed documented beta blocker date and time   Airway Mallampati: II TM Distance: >3 FB Neck ROM: full    Dental  (+) Teeth Intact   Pulmonary          Cardiovascular hypertension, Pt. on medications     Neuro/Psych    GI/Hepatic   Endo/Other    Renal/GU      Musculoskeletal   Abdominal   Peds  Hematology   Anesthesia Other Findings   Reproductive/Obstetrics                          Anesthesia Physical Anesthesia Plan  ASA: II  Anesthesia Plan: General   Post-op Pain Management:    Induction: Intravenous  Airway Management Planned: Oral ETT  Additional Equipment:   Intra-op Plan:   Post-operative Plan: Extubation in OR  Informed Consent: I have reviewed the patients History and Physical, chart, labs and discussed the procedure including the risks, benefits and alternatives for the proposed anesthesia with the patient or authorized representative who has indicated his/her understanding and acceptance.     Plan Discussed with: CRNA and Surgeon  Anesthesia Plan Comments:         Anesthesia Quick Evaluation

## 2011-05-22 NOTE — Progress Notes (Signed)
Patient ID: James Harmon, male   DOB: [birth year], 61 y.o.   MRN: [id number]   LOS: 1 day   Subjective: No unexpected c/o. Pain controlled. Wants to eat.  Objective: Vital signs in last 24 hours: Temp:  [98.1 F (36.7 C)-99.9 F (37.7 C)] 99 F (37.2 C) (03/27 0400) Pulse Rate:  [64-104] 83  (03/27 0700) Resp:  [14-24] 19  (03/27 0700) BP: (98-146)/(43-80) 122/68 mmHg (03/27 0700) SpO2:  [91 %-99 %] 96 % (03/27 0700) Weight:  [76.6 kg (168 lb 14 oz)-77.111 kg (170 lb)] 76.6 kg (168 lb 14 oz) (03/26 1820)    Lab Results:  CBC  Basename 05/22/11 0425 05/21/11 1247  WBC 9.7 6.4  HGB 12.7* 14.4  HCT 36.3* 42.1  PLT 258 292   BMET  Basename 05/22/11 0425 05/21/11 1247  NA 137 137  K 3.6 3.7  CL 105 100  CO2 24 28  GLUCOSE 141* 135*  BUN 15 22  CREATININE 0.70 0.99  CALCIUM 8.3* 9.2    General appearance: alert and no distress Resp: clear to auscultation bilaterally Cardio: regular rate and rhythm GI: normal findings: bowel sounds normal and soft, non-tender Extremities: NVI Incision/Wound:Wounds clean Neuro: A+A  Assessment/Plan: Fall TBI w/SDH -- per Dr. Jeral Fruit. Follow clinically. Ok for OR and for d/c when ready. Multiple facial fxs/lacerations -- Nonoperative Right wrist fx -- ORIF per Dr. Lajoyce Corners, likely tonight. ABL anemia -- Mild FEN -- Will give breakfast then make NPO VTE -- SCD's Dispo -- ORIF wrist   Freeman Caldron, PA-C Pager: [phone] General Trauma PA Pager: [phone]   05/22/2011

## 2011-05-22 NOTE — Anesthesia Procedure Notes (Signed)
Procedure Name: Intubation Date/Time: 05/22/2011 5:05 PM Performed by: Molli Hazard Pre-anesthesia Checklist: Patient identified, Emergency Drugs available, Suction available and Patient being monitored Patient Re-evaluated:Patient Re-evaluated prior to inductionOxygen Delivery Method: Circle system utilized Preoxygenation: Pre-oxygenation with 100% oxygen Intubation Type: IV induction, Rapid sequence and Cricoid Pressure applied Laryngoscope Size: Miller and 2 Grade View: Grade I Tube type: Oral Tube size: 7.5 mm Number of attempts: 1 Airway Equipment and Method: Stylet Placement Confirmation: ETT inserted through vocal cords under direct vision,  positive ETCO2 and breath sounds checked- equal and bilateral Secured at: 24 cm Tube secured with: Tape Dental Injury: Teeth and Oropharynx as per pre-operative assessment

## 2011-05-22 NOTE — Transfer of Care (Signed)
Immediate Anesthesia Transfer of Care Note  Patient: James Harmon  Procedure(s) Performed: Procedure(s) (LRB): OPEN REDUCTION INTERNAL FIXATION (ORIF) DISTAL RADIAL FRACTURE (Right)  Patient Location: PACU  Anesthesia Type: General  Level of Consciousness: awake and alert   Airway & Oxygen Therapy: Patient connected to face mask oxygen  Post-op Assessment: Report given to PACU RN, Post -op Vital signs reviewed and stable and Patient moving all extremities  Post vital signs: Reviewed and stable  Complications: No apparent anesthesia complications

## 2011-05-22 NOTE — Preoperative (Signed)
Beta Blockers   Reason not to administer Beta Blockers:Not Applicable 

## 2011-05-22 NOTE — Progress Notes (Signed)
UR of chart complete.  

## 2011-05-22 NOTE — Progress Notes (Signed)
ANTICOAGULATION CONSULT NOTE - Initial Consult  Pharmacy Consult for Coumadin  Indication: VTE prophylaxis  No Known Allergies  Patient Measurements: Height: 5\' 9"  (175.3 cm) Weight: 168 lb 14 oz (76.6 kg) IBW/kg (Calculated) : 70.7   Vital Signs: Temp: 98.8 F (37.1 C) (03/27 2018) BP: 122/71 mmHg (03/27 2018) Pulse Rate: 92  (03/27 2018)  Labs:  Basename 05/22/11 0425 05/21/11 1247  HGB 12.7* 14.4  HCT 36.3* 42.1  PLT 258 292  APTT -- --  LABPROT -- 13.3  INR -- 0.99  HEPARINUNFRC -- --  CREATININE 0.70 0.99  CKTOTAL -- --  CKMB -- --  TROPONINI -- --   Estimated Creatinine Clearance: 97 ml/min (by C-G formula based on Cr of 0.7).  Medical History: Past Medical History  Diagnosis Date  . Hypertension     Medications:  Scheduled:    .  ceFAZolin (ANCEF) IV  2 g Intravenous Q6H  . neomycin-bacitracin-polymyxin   Topical Daily  . sodium chloride  4 spray Each Nare QID  . white petrolatum      . DISCONTD:  ceFAZolin (ANCEF) IV  1 g Intravenous Q8H  . DISCONTD: pantoprazole  40 mg Oral Q1200  . DISCONTD: pantoprazole (PROTONIX) IV  40 mg Intravenous Q1200    Assessment: 60 YOM admitted for trauma 2/2 fell from a ladder, s/p ORIF right distal radial fracture, to start coumadin for VTE prophylaxis. Baseline INR 0.99, hgb 12.7, plt 258  Goal of Therapy:  INR 2-3   Plan:  - Coumadin 7.5mg  po x 1 - f/u daily INR - coumadin education book and video  Bayard Hugger, PharmD, BCPS, [phone] 05/22/2011,8:26 PM

## 2011-05-22 NOTE — Consult Note (Signed)
Reason for Consult:right wrist distal radius fracture Referring Physician: Dr. Ranell Patrick James Harmon is an 62 y.o. male.  HPI: fell from ladder  Past Medical History  Diagnosis Date  . Hypertension     History reviewed. No pertinent past surgical history.  History reviewed. No pertinent family history.  Social History:  reports that he has never smoked. He does not have any smokeless tobacco history on file. He reports that he does not drink alcohol or use illicit drugs.  Allergies: No Known Allergies  Medications: I have reviewed the patient's current medications.  Results for orders placed during the hospital encounter of 05/21/11 (from the past 48 hour(s))  CBC     Status: Normal   Collection Time   05/21/11 12:47 PM      Component Value Range Comment   WBC 6.4  4.0 - 10.5 (K/uL)    RBC 4.76  4.22 - 5.81 (MIL/uL)    Hemoglobin 14.4  13.0 - 17.0 (g/dL)    HCT 08.6  57.8 - 46.9 (%)    MCV 88.4  78.0 - 100.0 (fL)    MCH 30.3  26.0 - 34.0 (pg)    MCHC 34.2  30.0 - 36.0 (g/dL)    RDW 62.9  52.8 - 41.3 (%)    Platelets 292  150 - 400 (K/uL)   ETHANOL     Status: Normal   Collection Time   05/21/11 12:47 PM      Component Value Range Comment   Alcohol, Ethyl (B) <11  0 - 11 (mg/dL)   BASIC METABOLIC PANEL     Status: Abnormal   Collection Time   05/21/11 12:47 PM      Component Value Range Comment   Sodium 137  135 - 145 (mEq/L)    Potassium 3.7  3.5 - 5.1 (mEq/L)    Chloride 100  96 - 112 (mEq/L)    CO2 28  19 - 32 (mEq/L)    Glucose, Bld 135 (*) 70 - 99 (mg/dL)    BUN 22  6 - 23 (mg/dL)    Creatinine, Ser 2.44  0.50 - 1.35 (mg/dL)    Calcium 9.2  8.4 - 10.5 (mg/dL)    GFR calc non Af Amer 87 (*) >90 (mL/min)    GFR calc Af Amer >90  >90 (mL/min)   PROTIME-INR     Status: Normal   Collection Time   05/21/11 12:47 PM      Component Value Range Comment   Prothrombin Time 13.3  11.6 - 15.2 (seconds)    INR 0.99  0.00 - 1.49    CBC     Status: Abnormal   Collection  Time   05/22/11  4:25 AM      Component Value Range Comment   WBC 9.7  4.0 - 10.5 (K/uL)    RBC 4.18 (*) 4.22 - 5.81 (MIL/uL)    Hemoglobin 12.7 (*) 13.0 - 17.0 (g/dL)    HCT 01.0 (*) 27.2 - 52.0 (%)    MCV 86.8  78.0 - 100.0 (fL)    MCH 30.4  26.0 - 34.0 (pg)    MCHC 35.0  30.0 - 36.0 (g/dL)    RDW 53.6  64.4 - 03.4 (%)    Platelets 258  150 - 400 (K/uL)   BASIC METABOLIC PANEL     Status: Abnormal   Collection Time   05/22/11  4:25 AM      Component Value Range Comment   Sodium 137  135 - 145 (mEq/L)    Potassium 3.6  3.5 - 5.1 (mEq/L)    Chloride 105  96 - 112 (mEq/L)    CO2 24  19 - 32 (mEq/L)    Glucose, Bld 141 (*) 70 - 99 (mg/dL)    BUN 15  6 - 23 (mg/dL)    Creatinine, Ser 2.59  0.50 - 1.35 (mg/dL)    Calcium 8.3 (*) 8.4 - 10.5 (mg/dL)    GFR calc non Af Amer >90  >90 (mL/min)    GFR calc Af Amer >90  >90 (mL/min)     Dg Chest 1 View  05/21/2011  *RADIOLOGY REPORT*  Clinical Data: Fall  CHEST - 1 VIEW  Comparison: None.  Findings: The heart size and mediastinal contours are within normal limits.  Both lungs are clear.  The visualized skeletal structures are unremarkable.  IMPRESSION: Negative exam.  Original Report Authenticated By: Rosealee Albee, M.D.   Dg Wrist Complete Right  05/21/2011  *RADIOLOGY REPORT*  Clinical Data: Fall  RIGHT WRIST - COMPLETE 3+ VIEW  Comparison: None.  Findings:  There is a comminuted, intra-articular fracture deformity involving the distal right radius.  There is dorsal angulation of the distal fracture fragments.  There is no radio-opaque foreign body or soft tissue calcifications.  IMPRESSION:  1.  Acute, comminuted, intra-articular fracture of the distal radius.  Per CMS PQRS reporting requirements (PQRS Measure 24): Given the patient's age of greater than 50 and the fracture site (hip, distal radius, or spine), the patient should be tested for osteoporosis using DXA, and the appropriate treatment considered based on the DXA results.  Original  Report Authenticated By: Rosealee Albee, M.D.   Ct Head Wo Contrast  05/21/2011  *RADIOLOGY REPORT*  Clinical Data:  Trauma, fall  CT HEAD WITHOUT CONTRAST  Technique: Contiguous axial images were obtained from the base of the skull through the vertex without intravenous contrast.  Comparison:   None.  Findings:  There is bilateral periorbital and intraorbital medial air.  There is a comminuted fracture of the nasal bones. Fracture of the left frontal sinus anterior and posterior wall.  Fluid is noted in left frontal sinus.  There is bilateral nondisplaced fracture of the medial orbital wall lamina papyracea.  In axial image 17 there is nondisplaced fracture of the roof of the right orbital wall.  There probable nondisplaced fracture of the roof of the left orbital wall.  In axial image 16 there is probable nondisplaced fracture of the right sphenoid wing in posterior aspect of the right orbit. Fluid and air are noted in the left maxillary sinus.  Fluid noted bilateral ethmoid sinus.  Probable nondisplaced fracture posterior aspect of the ethmoid sinus on the right.  There is scalp soft tissue defect and small amount of  high density material probable foreign body midline anterior frontal region. There is nondisplaced fracture of the midline frontal bone.  Small amount of subdural hemorrhage noted in axial image 13 along the interhemispheric fissure.  No mass effect or midline shift.  No hydrocephalus.  The gray and white matter differentiation is preserved.  IMPRESSION: There is bilateral periorbital and intraorbital medial air.  There is a comminuted fracture of the nasal bones. Fracture of the left frontal sinus anterior and posterior wall.  Fluid is noted in left frontal sinus.  There is bilateral nondisplaced fracture of the medial orbital wall lamina papyracea.  In axial image 17 there is nondisplaced fracture of the roof of the right  orbital wall.  There probable nondisplaced fracture of the roof of the  left orbital wall.  In axial image 16 there is probable nondisplaced fracture of the right sphenoid wing in posterior aspect of the right orbit. Fluid and air are noted in the left maxillary sinus.  Fluid noted bilateral ethmoid sinus.  Probable nondisplaced fracture posterior aspect of the ethmoid sinus on the right.  There is scalp soft tissue defect and small amount of  high density material probable foreign body midline anterior frontal region. There is nondisplaced fracture of the midline frontal bone.  Small amount of subdural hemorrhage noted in axial image 13 along the interhemispheric fissure.  No mass effect or midline shift.  CT cervical spine without IV contrast findings:  Axial images of the cervical spine shows no acute fracture or subluxation.  There is no pneumothorax in visualized lung apices.  Computer processed images shows mild degenerative changes C1-C2 articulation.  There is disc space flattening with mild posterior spurring at C3-C4 and C4-C5 level.  Disc space flattening with mild anterior and mild posterior spurring noted at C5-C6 level.  There is disc space flattening with mild anterior and mild posterior spurring at C 71 level.  No prevertebral soft tissue swelling. Cervical airway is patent.  Coronal images shows no acute fracture or subluxation.  Impression: 1.  No acute fracture or subluxation. 2.  Degenerative changes as described above.  I discussed with Dr. Patria Mane.  Original Report Authenticated By: Natasha Mead, M.D.   Ct Cervical Spine Wo Contrast  05/21/2011  *RADIOLOGY REPORT*  Clinical Data:  Trauma, fall  CT HEAD WITHOUT CONTRAST  Technique: Contiguous axial images were obtained from the base of the skull through the vertex without intravenous contrast.  Comparison:   None.  Findings:  There is bilateral periorbital and intraorbital medial air.  There is a comminuted fracture of the nasal bones. Fracture of the left frontal sinus anterior and posterior wall.  Fluid is noted in  left frontal sinus.  There is bilateral nondisplaced fracture of the medial orbital wall lamina papyracea.  In axial image 17 there is nondisplaced fracture of the roof of the right orbital wall.  There probable nondisplaced fracture of the roof of the left orbital wall.  In axial image 16 there is probable nondisplaced fracture of the right sphenoid wing in posterior aspect of the right orbit. Fluid and air are noted in the left maxillary sinus.  Fluid noted bilateral ethmoid sinus.  Probable nondisplaced fracture posterior aspect of the ethmoid sinus on the right.  There is scalp soft tissue defect and small amount of  high density material probable foreign body midline anterior frontal region. There is nondisplaced fracture of the midline frontal bone.  Small amount of subdural hemorrhage noted in axial image 13 along the interhemispheric fissure.  No mass effect or midline shift.  No hydrocephalus.  The gray and white matter differentiation is preserved.  IMPRESSION: There is bilateral periorbital and intraorbital medial air.  There is a comminuted fracture of the nasal bones. Fracture of the left frontal sinus anterior and posterior wall.  Fluid is noted in left frontal sinus.  There is bilateral nondisplaced fracture of the medial orbital wall lamina papyracea.  In axial image 17 there is nondisplaced fracture of the roof of the right orbital wall.  There probable nondisplaced fracture of the roof of the left orbital wall.  In axial image 16 there is probable nondisplaced fracture of the right sphenoid wing in posterior  aspect of the right orbit. Fluid and air are noted in the left maxillary sinus.  Fluid noted bilateral ethmoid sinus.  Probable nondisplaced fracture posterior aspect of the ethmoid sinus on the right.  There is scalp soft tissue defect and small amount of  high density material probable foreign body midline anterior frontal region. There is nondisplaced fracture of the midline frontal bone.   Small amount of subdural hemorrhage noted in axial image 13 along the interhemispheric fissure.  No mass effect or midline shift.  CT cervical spine without IV contrast findings:  Axial images of the cervical spine shows no acute fracture or subluxation.  There is no pneumothorax in visualized lung apices.  Computer processed images shows mild degenerative changes C1-C2 articulation.  There is disc space flattening with mild posterior spurring at C3-C4 and C4-C5 level.  Disc space flattening with mild anterior and mild posterior spurring noted at C5-C6 level.  There is disc space flattening with mild anterior and mild posterior spurring at C 71 level.  No prevertebral soft tissue swelling. Cervical airway is patent.  Coronal images shows no acute fracture or subluxation.  Impression: 1.  No acute fracture or subluxation. 2.  Degenerative changes as described above.  I discussed with Dr. Patria Mane.  Original Report Authenticated By: Natasha Mead, M.D.   Ct Maxillofacial Wo Cm  05/21/2011  *RADIOLOGY REPORT*  Clinical Data: Fall.  Forehead laceration.  CT MAXILLOFACIAL WITHOUT CONTRAST  Technique:  Multidetector CT imaging of the maxillofacial structures was performed. Multiplanar CT image reconstructions were also generated.  Comparison: Head CT earlier today.  Findings: Frontal bone fracture is noted through the anterior and posterior wall of the left frontal sinus.  Blood/fluid noted within the left frontal sinus.  The soft tissue/scalp laceration is noted in the overlying soft tissues which extends to the bone surface. Multiple small radiopaque foreign bodies within this laceration. Fractures through the medial orbital walls bilaterally.  The right medial orbital wall fracture is far posterior extensive intraorbital and periorbital air.  Extensive bilateral displaced nasal bone fractures.  No visible orbital floor fractures.  Suspect a fracture through the roof of the right orbit.  Zygomatic arches are intact.   Mandible is intact.  Blood is noted throughout the paranasal sinuses.  Globes appear intact.  IMPRESSION: Fracture through the anterior and posterior walls of the left frontal sinus.  Large soft tissue laceration extends to the bone overlying this fracture.  Small radiopaque foreign bodies within the soft tissues.  Fracture through the medial orbital walls bilaterally.  The right medial orbital wall fracture is far posterior.  Extensive intra orbital and periorbital air.  Suspect fracture through the roof of the right orbit, difficult to visualize.  Orbital floors and lateral orbital walls appear intact.  Extensive bilateral displaced nasal bone fractures.  Original Report Authenticated By: Cyndie Chime, M.D.    Review of Systems  All other systems reviewed and are negative.   Blood pressure 100/55, pulse 75, temperature 99 F (37.2 C), temperature source Oral, resp. rate 14, height 5\' 9"  (1.753 m), weight 76.6 kg (168 lb 14 oz), SpO2 93.00%. Physical Exam Mover fingers well, right hand NVI Assessment/Plan: Right distal radius displaced fracture  Plan:- will require ORIF of wrist, will schedule when cleared by trauma and neuro, call [phone] when patient clear for surgery  Pascale Maves V 05/22/2011, 7:01 AM

## 2011-05-22 NOTE — Progress Notes (Signed)
The patient should go to OR today with orthopedic surgery.  Will see patient on the floor afterwards.  Doing well now.  James Harmon. Gae Bon, MD, FACS [phone] Trauma Surgeon

## 2011-05-22 NOTE — Anesthesia Postprocedure Evaluation (Signed)
Anesthesia Post Note  Patient: James Harmon  Procedure(s) Performed: Procedure(s) (LRB): OPEN REDUCTION INTERNAL FIXATION (ORIF) DISTAL RADIAL FRACTURE (Right)  Anesthesia type: general  Patient location: PACU  Post pain: Pain level controlled  Post assessment: Patient's Cardiovascular Status Stable  Last Vitals:  Filed Vitals:   05/22/11 1905  BP:   Pulse: 84  Temp:   Resp: 19    Post vital signs: Reviewed and stable  Level of consciousness: sedated  Complications: No apparent anesthesia complications

## 2011-05-22 NOTE — Progress Notes (Signed)
Patient ID: James Harmon, male   DOB: [birth year], 61 y.o.   MRN: [id number] Had wrist surgery. Neuro stable. If discharge tomorrow i will see hom in my office in 2 to 3 weeks or before prn

## 2011-05-23 DIAGNOSIS — S069X9A Unspecified intracranial injury with loss of consciousness of unspecified duration, initial encounter: Secondary | ICD-10-CM

## 2011-05-23 LAB — PROTIME-INR
INR: 1.21 (ref 0.00–1.49)
Prothrombin Time: 15.6 seconds — ABNORMAL HIGH (ref 11.6–15.2)

## 2011-05-23 MED ORDER — WARFARIN SODIUM 6 MG PO TABS
6.0000 mg | ORAL_TABLET | Freq: Once | ORAL | Status: DC
  Filled 2011-05-23: qty 1

## 2011-05-23 MED ORDER — VITAMIN K1 10 MG/ML IJ SOLN
10.0000 mg | Freq: Once | INTRAMUSCULAR | Status: AC
  Administered 2011-05-23 (×2): 10 mg via INTRAVENOUS
  Filled 2011-05-23 (×2): qty 1

## 2011-05-23 MED ORDER — BACITRACIN-NEOMYCIN-POLYMYXIN OINTMENT TUBE: 1.0000 "application " | TOPICAL_OINTMENT | Freq: Every day | CUTANEOUS | Status: DC

## 2011-05-23 MED ORDER — AMOXICILLIN-POT CLAVULANATE 875-125 MG PO TABS: 1.0000 | ORAL_TABLET | Freq: Two times a day (BID) | ORAL | Status: AC

## 2011-05-23 MED ORDER — OXYCODONE-ACETAMINOPHEN 5-325 MG PO TABS: 1.0000 | ORAL_TABLET | ORAL | Status: AC | PRN

## 2011-05-23 MED ORDER — SALINE SPRAY 0.65 % NA SOLN: 4.0000 | Freq: Four times a day (QID) | NASAL | Status: DC

## 2011-05-23 NOTE — Progress Notes (Signed)
ANTICOAGULATION CONSULT NOTE - Initial Consult  Pharmacy Consult for Coumadin  Indication: VTE prophylaxis  No Known Allergies  Patient Measurements: Height: 5\' 9"  (175.3 cm) Weight: 168 lb 14 oz (76.6 kg) IBW/kg (Calculated) : 70.7   Vital Signs: Temp: 100.7 F (38.2 C) (03/28 0509) BP: 126/73 mmHg (03/28 0509) Pulse Rate: 90  (03/28 0509)  Labs:  Basename 05/23/11 0510 05/22/11 0425 05/21/11 1247  HGB -- 12.7* 14.4  HCT -- 36.3* 42.1  PLT -- 258 292  APTT -- -- --  LABPROT 15.6* -- 13.3  INR 1.21 -- 0.99  HEPARINUNFRC -- -- --  CREATININE -- 0.70 0.99  CKTOTAL -- -- --  CKMB -- -- --  TROPONINI -- -- --   Estimated Creatinine Clearance: 97 ml/min (by C-G formula based on Cr of 0.7).  Medical History: Past Medical History  Diagnosis Date  . Hypertension     Medications:  Scheduled:     .  ceFAZolin (ANCEF) IV  2 g Intravenous Q6H  . neomycin-bacitracin-polymyxin   Topical Daily  . patient's guide to using coumadin book   Does not apply Once  . sodium chloride  4 spray Each Nare QID  . warfarin  7.5 mg Oral ONCE-1800  . warfarin   Does not apply Once  . Warfarin - Pharmacist Dosing Inpatient   Does not apply q1800  . DISCONTD:  ceFAZolin (ANCEF) IV  1 g Intravenous Q8H  . DISCONTD: pantoprazole  40 mg Oral Q1200  . DISCONTD: pantoprazole (PROTONIX) IV  40 mg Intravenous Q1200    Assessment: 60 YOM admitted for trauma 2/2 fell from a ladder, s/p ORIF right distal radial fracture, on coumadin for VTE prophylaxis. INR 1.21  Goal of Therapy:  INR 2-3   Plan:  - Coumadin 6mg  po x 1 - f/u daily INR  Talbert Cage, PharmD, [phone] 05/23/2011,9:17 AM

## 2011-05-23 NOTE — Progress Notes (Signed)
Was given coumadin last night per ortho order set.  I D/W Dr. Jeral Fruit and he said it is still OK to D/C.  The patient's SDH was very small and he is stable neurologically.  We will give vitamin K just to be safe. Patient examined and I agree with the assessment and plan  Violeta Gelinas, MD, MPH, FACS Pager: [phone]  05/23/2011 9:52 AM

## 2011-05-23 NOTE — Discharge Summary (Signed)
Physician Discharge Summary  Patient ID: James Harmon MRN: [id number] DOB/AGE: [birth year] 61 y.o.  Admit date: 05/21/2011 Discharge date: 05/23/2011  Admission Diagnoses: Fall from ladder with head injury and facial injuries and wrist fracture  Discharge Diagnoses:  Active Problems:  Fall from ladder  SDH   Frontal sinus fracture  Frontal skull fracture  Bilateral orbit fractures  Nasal bone fractures  Multiple facial lacerations  Right distal radius fracture  Acute blood loss anemia   Discharged Condition: good  Hospital Course: Chief Complaint:   Multitrauma HPI: He was up on a latter 13 feet and fell onto his face and right hand. He was taken by private vehicle the was a long hospital where he was evaluated. He was found to have multiple facial fractures and facial lacerations, a small amount of subdural blood, and a right wrist fracture. He denies loss of consciousness. He denies neck pain. He denies chest pain or shortness of breath. He denies abdominal pain or back pain. He reports right wrist pain and some facial pain. His facial lacerations were sutured by the ED staff.    He was admitted and seen by the multiple consultants listed below.  It was felt he could be treated conservatively from the standpoint of his subdural hematoma and he did well with regards to this. He did receive a dose of warfarin after his wrist surgery on 05/22/11 and therefore was given Vitamin K 10mg  IV x 1 prior to discharge. This was discussed with neurosurgery and as the patient was doing well clinically, it was felt his could be safely discharged home with family.  He underwent ORIF of the right wrist fracture on 05/22/11 as noted per Dr. Lajoyce Corners and he will follow up with Dr. Lajoyce Corners in 2 wk.  Dr Annalee Genta recommended follow up in 10-14 days for his multiple facial fractures including fracture through both anterior and posterior walls of the left frontal sinus and bilateral displaced nasal fractures. These will  be re-evaluated at his follow up visit . He was discharged on Augmentin.  He was ambulatory and tolerating his diet and was deemed stable for discharge today.   Consults: orthopedic surgery- Dr. Lajoyce Corners and Neurosurgery- Dr. Jeral Fruit, and ENT- Dr. Annalee Genta  Significant Diagnostic Studies: radiology: CT scan: Order Status: Completed Resulted: 05/21/11 1348 Narrative: *RADIOLOGY REPORT*  Clinical Data: Trauma, fall  CT HEAD WITHOUT CONTRAST  Technique: Contiguous axial images were obtained from the base of the skull through the vertex without intravenous contrast.  Comparison: None.  Findings: There is bilateral periorbital and intraorbital medial air. There is a comminuted fracture of the nasal bones. Fracture of the left frontal sinus anterior and posterior wall. Fluid is noted in left frontal sinus. There is bilateral nondisplaced fracture of the medial orbital wall lamina papyracea. In axial image 17 there is nondisplaced fracture of the roof of the right orbital wall. There probable nondisplaced fracture of the roof of the left orbital wall. In axial image 16 there is probable nondisplaced fracture of the right sphenoid wing in posterior aspect of the right orbit. Fluid and air are noted in the left maxillary sinus.  Fluid noted bilateral ethmoid sinus. Probable nondisplaced fracture posterior aspect of the ethmoid sinus on the right.  There is scalp soft tissue defect and small amount of high density material probable foreign body midline anterior frontal region. There is nondisplaced fracture of the midline frontal bone.  Small amount of subdural hemorrhage noted in axial image 13 along the interhemispheric fissure. No  mass effect or midline shift. No hydrocephalus. The gray and white matter differentiation is preserved.  IMPRESSION: There is bilateral periorbital and intraorbital medial air. There is a comminuted fracture of the nasal bones. Fracture of the left frontal  sinus anterior and posterior wall. Fluid is noted in left frontal sinus. There is bilateral nondisplaced fracture of the medial orbital wall lamina papyracea. In axial image 17 there is nondisplaced fracture of the roof of the right orbital wall. There probable nondisplaced fracture of the roof of the left orbital wall. In axial image 16 there is probable nondisplaced fracture of the right sphenoid wing in posterior aspect of the right orbit. Fluid and air are noted in the left maxillary sinus.  Fluid noted bilateral ethmoid sinus. Probable nondisplaced fracture posterior aspect of the ethmoid sinus on the right.  There is scalp soft tissue defect and small amount of high density material probable foreign body midline anterior frontal region. There is nondisplaced fracture of the midline frontal bone.  Small amount of subdural hemorrhage noted in axial image 13 along the interhemispheric fissure. No mass effect or midline shift.  CT cervical spine without IV contrast findings:  Axial images of the cervical spine shows no acute fracture or subluxation. There is no pneumothorax in visualized lung apices.  Computer processed images shows mild degenerative changes C1-C2 articulation. There is disc space flattening with mild posterior spurring at C3-C4 and C4-C5 level. Disc space flattening with mild anterior and mild posterior spurring noted at C5-C6 level. There is disc space flattening with mild anterior and mild posterior spurring at C 71 level. No prevertebral soft tissue swelling. Cervical airway is patent.  Coronal images shows no acute fracture or subluxation.  Impression: 1. No acute fracture or subluxation. 2. Degenerative changes as described above.  I discussed with Dr. Patria Mane.  Original Report Authenticated By: Natasha Mead, M.D. CT Cervical Spine Wo Contrast [[id number]] Order Status: Completed Resulted: 05/21/11 1348 Narrative: *RADIOLOGY REPORT*  Clinical Data: Trauma,  fall  CT HEAD WITHOUT CONTRAST  Technique: Contiguous axial images were obtained from the base of the skull through the vertex without intravenous contrast.  Comparison: None.  Findings: There is bilateral periorbital and intraorbital medial air. There is a comminuted fracture of the nasal bones. Fracture of the left frontal sinus anterior and posterior wall. Fluid is noted in left frontal sinus. There is bilateral nondisplaced fracture of the medial orbital wall lamina papyracea. In axial image 17 there is nondisplaced fracture of the roof of the right orbital wall. There probable nondisplaced fracture of the roof of the left orbital wall. In axial image 16 there is probable nondisplaced fracture of the right sphenoid wing in posterior aspect of the right orbit. Fluid and air are noted in the left maxillary sinus.  Fluid noted bilateral ethmoid sinus. Probable nondisplaced fracture posterior aspect of the ethmoid sinus on the right.  There is scalp soft tissue defect and small amount of high density material probable foreign body midline anterior frontal region. There is nondisplaced fracture of the midline frontal bone.  Small amount of subdural hemorrhage noted in axial image 13 along the interhemispheric fissure. No mass effect or midline shift. No hydrocephalus. The gray and white matter differentiation is preserved.  IMPRESSION: There is bilateral periorbital and intraorbital medial air. There is a comminuted fracture of the nasal bones. Fracture of the left frontal sinus anterior and posterior wall. Fluid is noted in left frontal sinus. There is bilateral nondisplaced fracture of the medial orbital  wall lamina papyracea. In axial image 17 there is nondisplaced fracture of the roof of the right orbital wall. There probable nondisplaced fracture of the roof of the left orbital wall. In axial image 16 there is probable nondisplaced fracture of the right sphenoid wing in  posterior aspect of the right orbit. Fluid and air are noted in the left maxillary sinus.  Fluid noted bilateral ethmoid sinus. Probable nondisplaced fracture posterior aspect of the ethmoid sinus on the right.  There is scalp soft tissue defect and small amount of high density material probable foreign body midline anterior frontal region. There is nondisplaced fracture of the midline frontal bone.  Small amount of subdural hemorrhage noted in axial image 13 along the interhemispheric fissure. No mass effect or midline shift.  CT cervical spine without IV contrast findings:  Axial images of the cervical spine shows no acute fracture or subluxation. There is no pneumothorax in visualized lung apices.  Computer processed images shows mild degenerative changes C1-C2 articulation. There is disc space flattening with mild posterior spurring at C3-C4 and C4-C5 level. Disc space flattening with mild anterior and mild posterior spurring noted at C5-C6 level. There is disc space flattening with mild anterior and mild posterior spurring at C 71 level. No prevertebral soft tissue swelling. Cervical airway is patent.  Coronal images shows no acute fracture or subluxation.  Impression: 1. No acute fracture or subluxation. 2. Degenerative changes as described above.  I discussed with Dr. Patria Mane.  Original Report Authenticated By: Natasha Mead, M.D. DG Wrist Complete Right [[id number]] Order Status: Completed Resulted: 05/21/11 1334 Narrative: *RADIOLOGY REPORT*  Clinical Data: Fall  RIGHT WRIST - COMPLETE 3+ VIEW  Comparison: None.  Findings:  There is a comminuted, intra-articular fracture deformity involving the distal right radius.  There is dorsal angulation of the distal fracture fragments.  There is no radio-opaque foreign body or soft tissue calcifications.  IMPRESSION:  1. Acute, comminuted, intra-articular fracture of the distal radius.    Treatments: surgery:    Nadara Mustard, MD Physician Signed  Op Note 05/22/2011 6:41 PM  OPERATIVE REPORT  DATE OF SURGERY: 05/22/2011  PATIENT:  Beau Fanny,  61 y.o. male  PRE-OPERATIVE DIAGNOSIS:  Right wrist fracture  POST-OPERATIVE DIAGNOSIS:  Right wrist fracture  PROCEDURE:  Procedure(s): OPEN REDUCTION INTERNAL FIXATION (ORIF) DISTAL RADIAL FRACTURE  SURGEON:  Surgeon(s): Nadara Mustard, MD     Discharge Exam: Blood pressure 126/73, pulse 90, temperature 100.7 F (38.2 C), temperature source Oral, resp. rate 18, height 5\' 9"  (1.753 m), weight 76.6 kg (168 lb 14 oz), SpO2 95.00%. General appearance: alert and no distress Resp: clear to auscultation bilaterally Cardio: regular rate and rhythm GI: normal findings: bowel sounds normal and soft, non-tender Extremities: NVI Incision/Wound:Wounds clean, scabbing over.   Neuro: Alert and oriented x 3.     Disposition: Home with family   Medication List  As of 05/23/2011 10:00 AM   TAKE these medications         amoxicillin-clavulanate 875-125 MG per tablet   Commonly known as: AUGMENTIN   Take 1 tablet by mouth 2 (two) times daily.      lisinopril 5 MG tablet   Commonly known as: PRINIVIL,ZESTRIL   Take 5 mg by mouth daily.      neomycin-bacitracin-polymyxin Oint   Commonly known as: NEOSPORIN   Apply 1 application topically daily.      oxyCODONE-acetaminophen 5-325 MG per tablet   Commonly known as: PERCOCET   Take 1-2 tablets  by mouth every 4 (four) hours as needed for pain.      sodium chloride 0.65 % Soln nasal spray   Commonly known as: OCEAN   Place 4 sprays into the nose 4 (four) times daily.           Follow-up Information    Follow up with DUDA,MARCUS V, MD. Schedule an appointment as soon as possible for a visit in 2 weeks.   Contact information:   [address] [place] [address] [phone]       Follow up with Karn Cassis, MD. Schedule an appointment as soon as possible for a visit  in 2 weeks.   Contact information:   [address] [place] [address] [phone]       Follow up with SHOEMAKER, DAVID, MD. Schedule an appointment as soon as possible for a visit in 10 days.   Contact information:   [address], [address], [address] [place] [address] [phone]       Follow up with TRAUMA MD, MD on 05/30/2011. (2:00pm)    Contact information:   [address] [facility]  [phone]         Signed: Franki Monte Pager [phone] General Trauma Pager [phone]

## 2011-05-23 NOTE — Progress Notes (Addendum)
Clinical Social Worker has completed SBIRT with patient at bedside.  Patient states that he does not drink.  Patient has no alcohol concerns at this time.  No resources necessary at discharge.  Patient plans to return home with his wife, who can provide 24 hour assistance, at discharge.  Clinical Social Worker will sign off for now as social work intervention is no longer needed. Please consult [place] again if new need arises.  Arnette Norris, MSW Intern  [place], [place] [phone]

## 2011-05-23 NOTE — Discharge Instructions (Signed)
No nose blowing, continue to use saline nasal spray as instructed.   Follow up appointments above as instructed.  Acute Brain Injury, Guide for Family This is basic information about brain injury and its treatment. Please read it at your own pace. As you learn more about the brain, you will have many questions. Members of the health care team will do their best to answer your questions. Definite answers may not be known because the long term effects of brain injury can be difficult to predict. HOW DOES THE BRAIN WORK? The brain controls the actions of the body and allows [place] to think, learn, and remember. The brain has three main parts which are the:  Cerebral hemispheres, left and right which are divided into lobes.   Cerebellum.   Brain stem.  Each section of the brain has special jobs to do and sections of the brain also work together. The left cerebral hemisphere controls the right side of the body and is usually responsible for speech. The right cerebral hemisphere controls the left side of the body and is usually responsible for creative thinking.  PROTECTION AND OXYGEN FOR THE BRAIN The brain controls many important functions. It needs good protection and blood supply. This is done in the following ways:  Skull: A hard bone that surrounds the brain tissue.   Dura: A tough covering around the brain tissue and the spinal cord.   Cerebrospinal Fluid (CSF) or Spinal Fluid: Fluid that flows through the ventricles and around the brain and spinal cord. The ventricles are spaces inside the brain. The cerebrospinal fluid acts like a "shock absorber" for the brain.   Blood: Provides oxygen and food for the brain.  WHAT TYPES OF BRAIN INJURIES MAY OCCUR? Even though the brain is well protected, it may be injured. Every injury is different. Most injuries are a result of bruising, bleeding, twisting, or tearing of brain tissue. Damage to the brain may occur at the time of injury. It may also  develop after the injury due to swelling or further bleeding. Some types of brain injury are:  Skull Fracture: a break in the bone that surrounds the brain. These fractures often heal on their own. Surgery may be needed if there has been damage to the brain tissue below the fracture.   Contusion/Concussion: a mild injury or bruise to the brain which may cause a short loss of consciousness. It may cause headaches, nausea, vomiting, dizziness, and problems with memory and concentration. This injury will not need surgery.   Coup/Contre-Coup: A [place] word that describes contusions that occur at two sites in the brain. When the head is hit, the impact causes the brain to bump the opposite side of the skull. Damage occurs at the site of impact and on the opposite side of the brain.   Epidural Hematoma: a blood clot that forms between the skull and the top lining of the brain (dura). This blood clot can cause fast changes in the pressure inside the brain. Emergency surgery may be needed. The size of the clot will determine if surgery is needed.   Subdural Hematoma: a blood clot that forms between the dura and the brain tissue. If this bleeding occurs quickly it is called an acute subdural hematoma. If it occurs slowly over several weeks, it is called a chronic subdural hematoma. The clot may cause increased pressure and may need to be removed surgically.   Intracerebral Hemorrhage: A blood clot deep in the middle of the brain that  is hard to remove. Pressure from this clot may cause damage to the brain. Surgery may be needed to relieve the pressure.   Diffuse Axonal Injury (DAI): Damage to the pathways (axons) that connect the different areas of the brain. This occurs when there is twisting and turning of the brain tissue at the time of injury. The brain messages are slowed or lost. Treatment is aimed at managing swelling in the brain because torn axons can not be repaired.   Anoxic Brain Injury: An injury  that results from a lack of oxygen to the brain. This is most often from a lack of blood flow due to injury or bleeding.  WHAT HAPPENS WHEN THE BRAIN IS INJURED? Damage to the brain may occur immediately, as a result of the injury, or it may develop as a result of swelling or bleeding that follows the injury. BRAIN WITH EDEMA: Changes can result from brain injury. The brain tissue may swell causing it to take up more room in the skull. This is called edema. When this occurs, the swollen brain tissue will push the other contents of the skull to the side. BRAIN WITH A HEMATOMA: There may be bruising called contusions or a collection of blood called a hematoma or clot. This may also push the other contents to one side. BRAIN WITH HYDROCEPHALUS: The flow of CSF may also become blocked. This will cause the open spaces (ventricles) to become enlarged. This is called hydrocephalus. Any of these changes can cause increased intracranial pressure. WHAT IS INTRACRANIAL PRESSURE? To understand intracranial pressure, think of the skull as a rigid box. After brain injury, the skull may become overfilled with swollen brain tissue, blood, or CSF. The skull will not stretch like skin to deal with swelling. The skull may become too full and increase the pressure on the brain tissue. This is called increased intracranial pressure.  WHAT IS CEREBRAL PERFUSION PRESSURE? Blood flow to the brain is called cerebral perfusion pressure. Blood pressure and intracranial pressure affect the cerebral perfusion pressure. If the blood pressure is low and/or the intracranial pressure is high, the blood flow to the brain may be limited. This causes decreased cerebral perfusion pressure.  HOW WILL THE PATIENT RESPOND TO A BRAIN INJURY? The patient's responses may vary depending on the type of injury or pressure changes in the brain. Possible responses include:   Agitation.   Confusion.   Patients may also have problems with  speech, vision, or muscle weakness in their arms or legs.   Decreased responses.   Coma. A state of unresponsiveness when patients do not speak or follow commands, and are unaware of their surroundings. The length of time a patient remains in a coma varies.  HOW ARE BRAIN INJURIES EVALUATED? Patients with brain injury require frequent assessments and diagnostic tests. These include:   Neurological Exam: A series of questions and simple commands to see if the patient can open their eyes, move, speak, and understand what is going on around them. For example: What is your name? Where are you? What day is it? Wiggle your toes. Hold up two fingers. A standard way to describe patient responses may be used. Most hospitals use the Glasgow Coma Scale or [place] Levels of Cognitive Functioning. You can read about these scales and what the scores mean in the Appendix.   X-ray: A picture that looks at bones to see if they are broken (fractured). It can also be used to take a picture of the chest to  look at the lungs. This test may be done at the bedside or in the X-ray department and takes between 5-30 minutes to complete.   CT Scan (CAT Scan): An X-ray that takes pictures of the brain or other parts of the body. The scan is painless but the patient must lie very still. The test takes 15-30 minutes to complete.   MRI (Magnetic Resonance Imaging) Scan: A large magnet and radio waves are used, instead of X-rays, to take pictures of the body's tissues. It is painless but noisy. The machine is shaped like a long tube. The patient must lie still on a flat table in the middle of the machine. The test takes about 60 minutes to complete.   Angiogram: A test to look at the blood vessels in the brain. Using a catheter, or small flexible tube, dye is put into an artery (usually in the groin) that supplies blood to the brain. This test can tell if the blood vessels have been damaged or are spasming. The test takes 1-3 hours.    ICP Monitor: A small tube placed into or just on top of the brain through a small hole in the skull. This will measure the pressure inside the brain (intracranial pressure).   EEG (Electroencephalograph): A test to measure electrical activity in the brain. Special patches called electrodes are applied to the head to measure the activity. The test is painless and can be done at the bedside or in the EEG department. The length of the test varies.  HOW ARE BRAIN INJURIES TREATED? The goals of brain injury treatment are to:   Stop any bleeding.   Prevent an increase in pressure within the skull.   Control the amount of pressure, when it does increase.   Maintain adequate blood flow to the brain.   Remove any large blood clots.  TREATMENT  Treatments will vary with the type of injury. Your caregiver will decide which ones are used. Some common treatments are:  Positioning: Usually the head of the bed will be elevated slightly and the neck kept straight. This position may decrease the intracranial pressure by allowing blood and CSF to drain from the brain. Please do not change the position of the bed without asking the nurse.   Fluid Restriction: It may be necessary to limit the fluids that a patient receives. The brain is like a sponge. It swells with extra fluid. Limiting fluids can help control the swelling. Please do not give fluids without asking the nurse.   Ventricular Drain (Ventriculostomy): A small tube is placed in the ventricle. It measures and controls pressure inside the skull. It can be used to drain some CSF (cerebrospinal fluid) from the brain.   Ventilator: A machine used to support the patient in their own breathing, or give the patient breaths. When the ventilator gives extra breaths, the blood vessels in the brain become smaller. This may help control the intracranial pressure.   Surgery: There are three types of surgery used with brain injury:   Craniotomy - The skull  is opened to relieve the causes of increased pressure inside the skull. Causes may be fractured bones, blood clots, or swollen brain tissue.   Burr holes - A small opening is made into the skull to remove blood clots.   Bone flap removal - A piece of bone is removed from the skull to relieve pressure caused by swollen brain tissue.  MEDICATION There are several types of medications used to treat brain injury.  Some of these include:  Diuretics are used to decrease the amount of water in the patient's body. This makes less water available to the brain for swelling.   Anticonvulsants are used to prevent seizures. Seizures occur as a result of extra electrical activity in the brain. There are several types of seizures. The most common type causes the patient to have jerking movements of the arms and legs followed by sleep. Other types may cause slight tremors of the face, or staring spells. Please notify the nurse or doctor if you see any of these signs. Some patients have a seizure at the time of injury while others may develop seizures after the injury.   Sedatives are given if the patient's intracranial pressure is very high and hard to control. This medicine puts the patient into a deep "sleep". This may help prevent more swelling and damage.  WHAT EQUIPMENT WILL YOU SEE WHEN YOU VISIT? Depending on the type of brain injury, different kinds of equipment will be used. Ask a member of the health care team if you have any questions about equipment.   Monitor: A machine that shows heart rate, breathing, blood pressure, intracranial pressure, and cerebral perfusion pressure.   Head Dressing: A bandage around the head used to keep the wound or incision clean and dry.   ICP Monitor: A small tube placed into or just on top of the brain through a small hole in the skull. This will measure the amount of pressure inside the brain (intracranial pressure).   Nasogastric Tube (NG): A tube placed through the  nose into the stomach that can be used to suction the stomach or provide liquid formula directly into the stomach.   Endotracheal Tube: A tube inserted through the patient's nose or mouth into the trachea (windpipe) to help with breathing and suctioning.   EKG Lead Wires: Wires connected to the chest with small patches that measure the heart rate and rhythm.   Intravenous Catheter (IV) and Intravenous Fluid: A flexible catheter which allows fluid, nutrients, and medicine to be given directly into a vein.   Ventilator: A machine used in the Intensive Care Unit to support the patient in their own breathing or give the patient breaths.   Anti-Embolism Stockings (Frequently call TEDS): Long white stockings used to help prevent the pooling of blood in the legs.   Sequential Compression Stockings (Frequently called Kendall's): Plastic leg wraps that help prevent blood clots by inflating and deflating around the legs.   Urinary "Foley" Catheter: A tube inserted into the bladder to drain and allow for accurate measurement of urine.  WHAT OTHER TREATMENTS MAY BE USED?  Antibiotics: Antibiotics are used to prevent and treat infections that occur. It is not unusual for people with brain injuries to get infections. They may get pneumonia, bladder infections, blood infections, or infections in the brain or cerebrospinal fluid called meningitis.   Chest PT and Suctioning: To prevent or treat pneumonia, staff may use a vibrating machine or may clap on the patient's chest. This loosens the phlegm in the lungs. Then the patient will be asked to cough. If the patient is not able to cough up the phlegm they must be suctioned. When a patient is suctioned a catheter is placed in the back of the throat or into the lungs.   Tracheostomy Janina Mayo): If the patient has a lot of lung secretions or is on a ventilator for a long time they may need a trach. A trach is a tube  placed in the trachea (windpipe). It will make it  easier for the patient to cough up phlegm. It also allows the nurse to suction the lungs. Initially the patient will be unable to talk while the trach is in place. As the patient improves, a talking trach may be used. A trach is usually not permanent.   Suctioning of the Stomach: Sometimes after brain injury, the stomach will stop working for a short time. This is called an ileus. Even though the stomach may not be working it continues to make acid. The acid may damage the stomach lining and cause stomach ulcers if they are not removed. A nasogastric tube (NG) will be placed through the nose into the stomach. This tube will be used to help remove stomach secretions. Medications may also be given to help prevent stomach ulcers.   Nutrition: Meeting nutrition and fluid needs is important after brain injury. Patients may be less active, yet have very high nutritional needs. At first, nutrition may be supplied by an IV. When the stomach starts working, an evaluation of chewing and swallowing safety will be completed. If the patient is too sleepy to eat, or unable to swallow, a small nasogastric feeding tube may be used for nutrition. The tube is placed through the nose into the stomach. Liquid formula will be given through the feeding tube. Feedings may be given continuously or several times a day. The dietician will assist with food and fluid selection. Milkshakes and liquid formulas may also be used to provide extra calories and high protein nutrition. A feeding tube may be used if the patient continues to be too sleepy to eat or unable to swallow. A gastrostomy tube is a feeding tube that goes in the stomach. A jejunostomy tube is a feeding tube that goes in the intestine.   Bowel and Bladder Care: Patients may not have control of their bowel or bladder. Catheters or diapers will be used until bowel and bladder control returns.   Skin Care: Some things that help prevent bedsores include turning the patient,  padding equipment, keeping skin clean and dry, using special mattresses, and making sure the patient gets enough calories.   Range of Motion (ROM) and Splints: Patients with brain injury may not be able to move their joints as much as needed. This can cause tight muscles and joints called contractures. Range of motion (ROM) exercises and special splints for hands and feet help prevent contractures.   Pain Control: Comfort measures and medication will be used for pain control. However, medications may be limited to types that do not cause drowsiness.  WHO WILL HELP AFTER BRAIN INJURY? Members of the health care team will work together with the patient, family, and friends during the hospital stay. Care will be centered on the individual needs of the patient. Family and friends are important members of the team.   Patient: The patient is the most important member of the team. Care will be planned based on how the patient responds to treatment.   Family and Friends: You provide emotional support to the patient. Family and friends also provide the health care team with important facts about the patient's past history and can help watch for changes. Other team members will show you what you can do to help with the recovery process.   Doctors: Neurosurgery doctors are specialists who help determine the type of brain injury and its treatment. They may perform surgery on the brain. They will work with other doctors if  the patient is in intensive care or has injuries to other parts of the body.   Nurses: Nurses check patient's vitals (temperature, blood pressure, heart and breathing rate) and watch for changes in strength and thinking. They help with daily cares such as eating and bathing. Nurses also coordinate care among the members of the health care team.   Social Workers: Social workers provide emotional support to help the patient and family adjust to being in the hospital. They coordinate discharge  planning, referral to community resources, and answer questions about insurance or disability.   Physical Therapists (PT): Physical therapists evaluate and treat weaknesses in the patient's strength, flexibility, balance, rolling, sitting, standing and walking. Treatment may include exercises or instruction in use of equipment such as walkers, canes, or wheelchairs.   Occupational Therapists (OT): Occupational therapists evaluate the patient's ability to perform dressing, bathing, homemaking and activities that require memory and organization. They provide treatment or equipment needed for safe independent living.   Speech Therapists: Speech therapists test and treat speech, language, thinking and swallowing problems.   Neuropsychologists: Neuropsychologists test thinking, memory, judgment, emotions, behavior and personality. This information can be used to help guide treatment. It will also help determine the amount of supervision that the patient needs when they leave the hospital.   Dieticians: Dieticians assess nutritional needs. They work with the patient and other team members to help the patient meet their nutritional goals.   Other staff members may work with the patient and family. These include:   Respiratory therapists.   Activity Therapists.   Clergy.   Child Life Therapists.   Patient Representatives.   Vocational Counselors.   Music Therapists.   Recreation therapists.  HOW WILL YOU REACT? When a friend or family member is hospitalized with a brain injury, it is normal for you to have many different emotions. These emotions will come and go at different times.   Panic and Fear: Panic and fear are common reactions after a family member has a brain injury. Fears are intense because you are worried the patient may not survive. Until the patient becomes medically stable, your physical and emotional signs of panic may continue. These symptoms include rapid breathing,  inability to sleep, decreased appetite and upset stomach. Some people may cry uncontrollably.   Shock and Denial: You may feel that what is happening is not real. You may notice things going on around you, but have trouble remembering information and conversations or meetings with others. You may also have a hard time understanding the seriousness of the injury that has occurred.   Anger: Many people feel angry that they or their loved ones are in this situation. This may be justified. You may be angry with the patient for putting themselves in a situation where they could be hurt. You may also be angry with family members, friends, or others involved in the accident. You may be upset with the health care team for not doing or saying what you think is right. These are all normal reactions.   Guilt: Guilt is also a normal reaction. You may feel you could have done something to prevent the accident from happening, even when this may be far from true. You may also think about past events and personal experiences with the patient that you wish could have been different or better. If you are feeling angry with the patient, you may also feel guilty about your anger. We encourage you to talk about your feelings with someone close  to you or a professional staff member.   Isolation: During this time you may feel distant from others. In this strange situation, you may have a hard time relating to others. You may think that others will not understand. You may isolate yourself because you think others are scared or disapprove of your feelings. However, a crisis such as a brain injury is a time when it is helpful to accept comfort, support and assistance from others.   Hope: As the patient begins to stabilize, your anxiety about survival will be combined with hope of recovery. Medical complications and slow recovery may increase anxiety. However, hope may be brought about by the smallest changes.  Any of these  emotions are normal reactions to a very stressful situation. You may find it helpful to discuss your feelings with friends, family, or the health care team. It may also be useful to write about your feelings and experiences in a daily journal.  WHAT ARE SUGGESTIONS FOR COPING? People cope with stressful situations in different ways. What works for one person may not be helpful to another. We hope some of these suggestions will help you get through this difficult time.   Write important information down in a journal or notebook. You can use this to keep track of questions you want to ask members of the health care team. It may also be useful to share with the patient.   Establish a "phone tree." Name one person for family and friends to call for information on the patient's condition.   Rotate family visits. If you need or want to leave the hospital, you may want to have someone stay with the patient so you can feel reassured the patient is not alone.   When someone offers to help, accept the offer. Try to be specific about how this person can help.   Express your feelings. Discuss your positive and negative feelings with family members, friends, and staff.   Be kind to yourself. Take time for a walk or have a meal with a friend. Also, try to leave the hospital for a meal or restful night of sleep. It is very important to take care of yourself. By taking care of your own needs, you will be more prepared to make good decisions and support your loved one.  WHAT ARE THE SIGNS OF IMPROVEMENT? "How long will it take for my loved one to get better?" "What will he or she be like?" Your health care team may have a hard time answering these questions. Age, extent of damage, length of time since injury, and past mental and physical health of the patient are factors used when predicting the extent of recovery.  Most patients follow a general pattern of recovery after a severe brain injury. This pattern is  divided into stages. It is important to know each patient is different and may not follow the stages exactly. Patients vary in the amount of time spent at each stage and their recovery may stop at any stage. Recovery may be grouped into the following four stages: Stage 1: Unresponsiveness: During this stage the patient does not respond consistently or appropriately. You may hear this stage referred to as a coma. You may notice different movements in the patient. These are referred to as reflexive or generalized responses.  Decerebrate: A reflex that causes straightening of the arms and legs.   Decorticate: A reflex that causes bending of the arms and straightening of the legs.   Generalized responses: Random  movement of the arms and legs for no specific reason.  Stage 2: Early Responses: During this stage the patient starts to respond to things that are happening to them. The responses will be more appropriate, but may be inconsistent or slow. The patient will start to have localized responses and follow simple commands. Some examples of early responses to watch for are: Localized response: These are appropriate movements by the patient in response to sound, touch, or sight. Turning toward a sound, pulling away from something uncomfortable, or following movement with the eyes are examples.  Following simple commands: Opening and closing eyes, sticking the tongue out, or gripping and releasing hands when asked are examples. Stage 3. Agitated and Confused: At this stage the patient is responding more consistently. The patient will be confused about where he or she is and what has happened. The patient will have difficulty with memory and behavior. The patient's confusion may lead to yelling, swearing, biting, or striking out. Do not be alarmed if soft wrist and ankle ties are used to protect the patient and prevent tubes from being pulled out. It is very important to remember this stage is a step toward  recovery and this behavior is not intentional. Stage 4. Higher Level Responses: The patient completes routine tasks without difficulty, but still needs help with problem solving, making judgments and decisions. The patient may not understand his or her limitations and safety is a big concern. Unusual or high stress situations make activities more difficult. The patient may seem more like the person you knew before. However, there may be personality changes. Unfortunately, there is no way to predict how long a person will remain in one stage or what the final outcome will be. The team will work during the hospital stay to achieve the best possible outcome.  HOW CAN YOU HELP WITH RECOVERY? The family and friends of a person with a brain injury are important members of the team. Your knowledge about the patient's emotional and physical needs is valuable, and so is your participation in helping take care of these needs. The following are suggestions for things you can do that correspond with the stages of recovery. Stage 1. Unresponsive Stage  At this stage the patient appears to be in a deep sleep and does not respond to their surroundings. The goal is to obtain a response to touch, sound, sight or smell.   When speaking to the patient, assume he or she understands what you are saying. Speak in a comforting, positive and familiar way.   Speak clearly and slowly about familiar people and memories.   When visitors are present, focus on the patient. Keep the number of visitors to 1 or 2 people at a time. Visits should be short. Other distractions (TV, radio) should be turned off when visiting.   Provide the patient with pictures, music, and personal items that are comforting and familiar. Use poster board or a bulletin board near the bed.   The nurses and therapists may encourage you to assist in care of the patient. You may be asked to help with hair care, shaving, applying skin lotion or gently  stretching and positioning the patient's arms and legs. If you do not feel comfortable with these activities, that is okay. The staff will understand.  Stage 2. Early Responses  At this stage the patient is beginning to respond to people and hospital surroundings. The responses may range from turning toward a familiar voice to moving an arm or  leg on request.   The goal is to increase the consistency of responses.   There may be a delayed response time when asking the patient to move, speak, or pay attention. Always wait 1-2 minutes for the requested response. Repeat your request only a couple of times during this time period.   Be aware that the patient's attention span may only be 5-10 minutes before fatigue and frustration set in.   Allow for rest periods. Turn off the TV, music, and lights, and limit visitors. The patient can become stressed by too much noise, light or stimulation.   Continue with suggestions listed in the "unresponsive" stage.  Stage 3. Agitated and Confused Responses  During this stage, things are confusing. The patient may begin to remember past events but may be unsure of surroundings and the reason for hospitalization. The goal is to help the patient become oriented and to continue to treat his or her physical needs. Provide one activity at a time and expect the patient to pay attention for only short periods. Keeping the noise level low helps the patient focus.   The patient may repeat a word, phrase, or activity over and over. Try to interest the patient in a different activity.   The patient may do socially unacceptable things during this time, such as swearing or hitting. This is common. Calmly tell the patient the behavior is not appropriate.   Help orient the patient to his or her surroundings with both visual and verbal information (such as the suggestions below). Remembering information from one time to another is difficult.   A calendar with the days marked  off.   A sign in the room telling them where they are.   A posted schedule with meal times, therapies, and special appointments.   To decrease frustration, allow the patient to move about with supervision.  Stage 4. Higher Level Responses  At this stage the patient is able to take part in daily routines with help for problem solving, making judgments, and decisions. Most of the suggestions from the previous stage continue to apply here. The goal is to decrease the amount of supervision needed and increase independence.   Help make the environment safe. Safety decisions may still be difficult for the patient to make. Praise safe decisions and give calm explanations about unsafe decisions. Learning is still difficult.   Encourage the use of memory aids, such as a date book, to help with appointments and daily routines.   Encourage brief rest periods because the patient will continue to need more rest.   Check with the health care team on activities that may be completed with or without supervision. These activities may include work or school re-entry, taking medications, driving, or managing money.  WHAT HAPPENS AFTER ACUTE CARE?  Discharge Planning: Early in the hospital stay, the social worker will meet with the patient and family to start discharge planning. Many patients will need care or therapy after they leave acute care. Some patients will be discharged to a nursing facility, while others will be discharged to their homes.   Discharge to a Nursing Facility: There are several types of nursing facilities.   Skilled care provides extensive nursing care and daily therapy. Many patients will start with skilled care and then move to acute rehabilitation care, intermediate care, or residential care.   Acute rehabilitation provides an inpatient program of intense therapy in a hospital. The patient will need to actively participate in 3 to 6 hours of  therapy per day (i.e. physical,  occupational, speech, and activity therapy).   Intermediate care provides less extensive nursing care and therapy. Residential care is for patients who are fairly independent and do not need routine nursing care or therapies.   Discharge to Home: If the patient goes home, they may still need therapy or other care. Some of the options are:   Outpatient therapy is provided at hospitals, clinics and some nursing homes. Outpatient physical therapy will help build up strength and endurance. Outpatient occupational, speech and cognitive therapy may also be needed. Family or friends may need to arrange transportation for therapy appointments.   Home health care programs are available in many communities. Some of the services they offer include in-home nursing care, homemaker and health aides, meals-on-wheels, adult day care, home therapy visits, medical equipment rental/purchase, and transportation.  MAKING THE CHOICE During the acute care hospital stay, the health care team will make recommendations about the level of care the patient will need at discharge. Then the patient and family will choose the agency that provides the services. Your choice may be based on insurance coverage, location, the patient's potential for recovery, and the patient's or family's feelings about the services provided. Some facilities will come to the hospital to see the patient and their medical record before they decide to accept the patient. If accepted, the date for transfer or discharge will be set.  Document Released: 02/11/2005 Document Revised: 01/31/2011 Document Reviewed: 03/10/2006 [facility] Patient Information 2012 [place], [place].Acute Brain Injury, Guide for Family This is basic information about brain injury and its treatment. Please read it at your own pace. As you learn more about the brain, you will have many questions. Members of the health care team will do their best to answer your questions. Definite answers  may not be known because the long term effects of brain injury can be difficult to predict. HOW DOES THE BRAIN WORK? The brain controls the actions of the body and allows [place] to think, learn, and remember. The brain has three main parts which are the:  Cerebral hemispheres, left and right which are divided into lobes.   Cerebellum.   Brain stem.  Each section of the brain has special jobs to do and sections of the brain also work together. The left cerebral hemisphere controls the right side of the body and is usually responsible for speech. The right cerebral hemisphere controls the left side of the body and is usually responsible for creative thinking.  PROTECTION AND OXYGEN FOR THE BRAIN The brain controls many important functions. It needs good protection and blood supply. This is done in the following ways:  Skull: A hard bone that surrounds the brain tissue.   Dura: A tough covering around the brain tissue and the spinal cord.   Cerebrospinal Fluid (CSF) or Spinal Fluid: Fluid that flows through the ventricles and around the brain and spinal cord. The ventricles are spaces inside the brain. The cerebrospinal fluid acts like a "shock absorber" for the brain.   Blood: Provides oxygen and food for the brain.  WHAT TYPES OF BRAIN INJURIES MAY OCCUR? Even though the brain is well protected, it may be injured. Every injury is different. Most injuries are a result of bruising, bleeding, twisting, or tearing of brain tissue. Damage to the brain may occur at the time of injury. It may also develop after the injury due to swelling or further bleeding. Some types of brain injury are:  Skull Fracture: a break  in the bone that surrounds the brain. These fractures often heal on their own. Surgery may be needed if there has been damage to the brain tissue below the fracture.   Contusion/Concussion: a mild injury or bruise to the brain which may cause a short loss of consciousness. It may cause  headaches, nausea, vomiting, dizziness, and problems with memory and concentration. This injury will not need surgery.   Coup/Contre-Coup: A [place] word that describes contusions that occur at two sites in the brain. When the head is hit, the impact causes the brain to bump the opposite side of the skull. Damage occurs at the site of impact and on the opposite side of the brain.   Epidural Hematoma: a blood clot that forms between the skull and the top lining of the brain (dura). This blood clot can cause fast changes in the pressure inside the brain. Emergency surgery may be needed. The size of the clot will determine if surgery is needed.   Subdural Hematoma: a blood clot that forms between the dura and the brain tissue. If this bleeding occurs quickly it is called an acute subdural hematoma. If it occurs slowly over several weeks, it is called a chronic subdural hematoma. The clot may cause increased pressure and may need to be removed surgically.   Intracerebral Hemorrhage: A blood clot deep in the middle of the brain that is hard to remove. Pressure from this clot may cause damage to the brain. Surgery may be needed to relieve the pressure.   Diffuse Axonal Injury (DAI): Damage to the pathways (axons) that connect the different areas of the brain. This occurs when there is twisting and turning of the brain tissue at the time of injury. The brain messages are slowed or lost. Treatment is aimed at managing swelling in the brain because torn axons can not be repaired.   Anoxic Brain Injury: An injury that results from a lack of oxygen to the brain. This is most often from a lack of blood flow due to injury or bleeding.  WHAT HAPPENS WHEN THE BRAIN IS INJURED? Damage to the brain may occur immediately, as a result of the injury, or it may develop as a result of swelling or bleeding that follows the injury. BRAIN WITH EDEMA: Changes can result from brain injury. The brain tissue may swell causing it  to take up more room in the skull. This is called edema. When this occurs, the swollen brain tissue will push the other contents of the skull to the side. BRAIN WITH A HEMATOMA: There may be bruising called contusions or a collection of blood called a hematoma or clot. This may also push the other contents to one side. BRAIN WITH HYDROCEPHALUS: The flow of CSF may also become blocked. This will cause the open spaces (ventricles) to become enlarged. This is called hydrocephalus. Any of these changes can cause increased intracranial pressure. WHAT IS INTRACRANIAL PRESSURE? To understand intracranial pressure, think of the skull as a rigid box. After brain injury, the skull may become overfilled with swollen brain tissue, blood, or CSF. The skull will not stretch like skin to deal with swelling. The skull may become too full and increase the pressure on the brain tissue. This is called increased intracranial pressure.  WHAT IS CEREBRAL PERFUSION PRESSURE? Blood flow to the brain is called cerebral perfusion pressure. Blood pressure and intracranial pressure affect the cerebral perfusion pressure. If the blood pressure is low and/or the intracranial pressure is high, the blood  flow to the brain may be limited. This causes decreased cerebral perfusion pressure.  HOW WILL THE PATIENT RESPOND TO A BRAIN INJURY? The patient's responses may vary depending on the type of injury or pressure changes in the brain. Possible responses include:   Agitation.   Confusion.   Patients may also have problems with speech, vision, or muscle weakness in their arms or legs.   Decreased responses.   Coma. A state of unresponsiveness when patients do not speak or follow commands, and are unaware of their surroundings. The length of time a patient remains in a coma varies.  HOW ARE BRAIN INJURIES EVALUATED? Patients with brain injury require frequent assessments and diagnostic tests. These include:   Neurological Exam:  A series of questions and simple commands to see if the patient can open their eyes, move, speak, and understand what is going on around them. For example: What is your name? Where are you? What day is it? Wiggle your toes. Hold up two fingers. A standard way to describe patient responses may be used. Most hospitals use the Glasgow Coma Scale or [place] Levels of Cognitive Functioning. You can read about these scales and what the scores mean in the Appendix.   X-ray: A picture that looks at bones to see if they are broken (fractured). It can also be used to take a picture of the chest to look at the lungs. This test may be done at the bedside or in the X-ray department and takes between 5-30 minutes to complete.   CT Scan (CAT Scan): An X-ray that takes pictures of the brain or other parts of the body. The scan is painless but the patient must lie very still. The test takes 15-30 minutes to complete.   MRI (Magnetic Resonance Imaging) Scan: A large magnet and radio waves are used, instead of X-rays, to take pictures of the body's tissues. It is painless but noisy. The machine is shaped like a long tube. The patient must lie still on a flat table in the middle of the machine. The test takes about 60 minutes to complete.   Angiogram: A test to look at the blood vessels in the brain. Using a catheter, or small flexible tube, dye is put into an artery (usually in the groin) that supplies blood to the brain. This test can tell if the blood vessels have been damaged or are spasming. The test takes 1-3 hours.   ICP Monitor: A small tube placed into or just on top of the brain through a small hole in the skull. This will measure the pressure inside the brain (intracranial pressure).   EEG (Electroencephalograph): A test to measure electrical activity in the brain. Special patches called electrodes are applied to the head to measure the activity. The test is painless and can be done at the bedside or in the EEG  department. The length of the test varies.  HOW ARE BRAIN INJURIES TREATED? The goals of brain injury treatment are to:   Stop any bleeding.   Prevent an increase in pressure within the skull.   Control the amount of pressure, when it does increase.   Maintain adequate blood flow to the brain.   Remove any large blood clots.  TREATMENT  Treatments will vary with the type of injury. Your caregiver will decide which ones are used. Some common treatments are:  Positioning: Usually the head of the bed will be elevated slightly and the neck kept straight. This position may decrease the  intracranial pressure by allowing blood and CSF to drain from the brain. Please do not change the position of the bed without asking the nurse.   Fluid Restriction: It may be necessary to limit the fluids that a patient receives. The brain is like a sponge. It swells with extra fluid. Limiting fluids can help control the swelling. Please do not give fluids without asking the nurse.   Ventricular Drain (Ventriculostomy): A small tube is placed in the ventricle. It measures and controls pressure inside the skull. It can be used to drain some CSF (cerebrospinal fluid) from the brain.   Ventilator: A machine used to support the patient in their own breathing, or give the patient breaths. When the ventilator gives extra breaths, the blood vessels in the brain become smaller. This may help control the intracranial pressure.   Surgery: There are three types of surgery used with brain injury:   Craniotomy - The skull is opened to relieve the causes of increased pressure inside the skull. Causes may be fractured bones, blood clots, or swollen brain tissue.   Burr holes - A small opening is made into the skull to remove blood clots.   Bone flap removal - A piece of bone is removed from the skull to relieve pressure caused by swollen brain tissue.  MEDICATION There are several types of medications used to treat brain  injury. Some of these include:  Diuretics are used to decrease the amount of water in the patient's body. This makes less water available to the brain for swelling.   Anticonvulsants are used to prevent seizures. Seizures occur as a result of extra electrical activity in the brain. There are several types of seizures. The most common type causes the patient to have jerking movements of the arms and legs followed by sleep. Other types may cause slight tremors of the face, or staring spells. Please notify the nurse or doctor if you see any of these signs. Some patients have a seizure at the time of injury while others may develop seizures after the injury.   Sedatives are given if the patient's intracranial pressure is very high and hard to control. This medicine puts the patient into a deep "sleep". This may help prevent more swelling and damage.  WHAT EQUIPMENT WILL YOU SEE WHEN YOU VISIT? Depending on the type of brain injury, different kinds of equipment will be used. Ask a member of the health care team if you have any questions about equipment.   Monitor: A machine that shows heart rate, breathing, blood pressure, intracranial pressure, and cerebral perfusion pressure.   Head Dressing: A bandage around the head used to keep the wound or incision clean and dry.   ICP Monitor: A small tube placed into or just on top of the brain through a small hole in the skull. This will measure the amount of pressure inside the brain (intracranial pressure).   Nasogastric Tube (NG): A tube placed through the nose into the stomach that can be used to suction the stomach or provide liquid formula directly into the stomach.   Endotracheal Tube: A tube inserted through the patient's nose or mouth into the trachea (windpipe) to help with breathing and suctioning.   EKG Lead Wires: Wires connected to the chest with small patches that measure the heart rate and rhythm.   Intravenous Catheter (IV) and Intravenous  Fluid: A flexible catheter which allows fluid, nutrients, and medicine to be given directly into a vein.   Ventilator: A  machine used in the Intensive Care Unit to support the patient in their own breathing or give the patient breaths.   Anti-Embolism Stockings (Frequently call TEDS): Long white stockings used to help prevent the pooling of blood in the legs.   Sequential Compression Stockings (Frequently called Kendall's): Plastic leg wraps that help prevent blood clots by inflating and deflating around the legs.   Urinary "Foley" Catheter: A tube inserted into the bladder to drain and allow for accurate measurement of urine.  WHAT OTHER TREATMENTS MAY BE USED?  Antibiotics: Antibiotics are used to prevent and treat infections that occur. It is not unusual for people with brain injuries to get infections. They may get pneumonia, bladder infections, blood infections, or infections in the brain or cerebrospinal fluid called meningitis.   Chest PT and Suctioning: To prevent or treat pneumonia, staff may use a vibrating machine or may clap on the patient's chest. This loosens the phlegm in the lungs. Then the patient will be asked to cough. If the patient is not able to cough up the phlegm they must be suctioned. When a patient is suctioned a catheter is placed in the back of the throat or into the lungs.   Tracheostomy Janina Mayo): If the patient has a lot of lung secretions or is on a ventilator for a long time they may need a trach. A trach is a tube placed in the trachea (windpipe). It will make it easier for the patient to cough up phlegm. It also allows the nurse to suction the lungs. Initially the patient will be unable to talk while the trach is in place. As the patient improves, a talking trach may be used. A trach is usually not permanent.   Suctioning of the Stomach: Sometimes after brain injury, the stomach will stop working for a short time. This is called an ileus. Even though the stomach may  not be working it continues to make acid. The acid may damage the stomach lining and cause stomach ulcers if they are not removed. A nasogastric tube (NG) will be placed through the nose into the stomach. This tube will be used to help remove stomach secretions. Medications may also be given to help prevent stomach ulcers.   Nutrition: Meeting nutrition and fluid needs is important after brain injury. Patients may be less active, yet have very high nutritional needs. At first, nutrition may be supplied by an IV. When the stomach starts working, an evaluation of chewing and swallowing safety will be completed. If the patient is too sleepy to eat, or unable to swallow, a small nasogastric feeding tube may be used for nutrition. The tube is placed through the nose into the stomach. Liquid formula will be given through the feeding tube. Feedings may be given continuously or several times a day. The dietician will assist with food and fluid selection. Milkshakes and liquid formulas may also be used to provide extra calories and high protein nutrition. A feeding tube may be used if the patient continues to be too sleepy to eat or unable to swallow. A gastrostomy tube is a feeding tube that goes in the stomach. A jejunostomy tube is a feeding tube that goes in the intestine.   Bowel and Bladder Care: Patients may not have control of their bowel or bladder. Catheters or diapers will be used until bowel and bladder control returns.   Skin Care: Some things that help prevent bedsores include turning the patient, padding equipment, keeping skin clean and dry, using special  mattresses, and making sure the patient gets enough calories.   Range of Motion (ROM) and Splints: Patients with brain injury may not be able to move their joints as much as needed. This can cause tight muscles and joints called contractures. Range of motion (ROM) exercises and special splints for hands and feet help prevent contractures.   Pain  Control: Comfort measures and medication will be used for pain control. However, medications may be limited to types that do not cause drowsiness.  WHO WILL HELP AFTER BRAIN INJURY? Members of the health care team will work together with the patient, family, and friends during the hospital stay. Care will be centered on the individual needs of the patient. Family and friends are important members of the team.   Patient: The patient is the most important member of the team. Care will be planned based on how the patient responds to treatment.   Family and Friends: You provide emotional support to the patient. Family and friends also provide the health care team with important facts about the patient's past history and can help watch for changes. Other team members will show you what you can do to help with the recovery process.   Doctors: Neurosurgery doctors are specialists who help determine the type of brain injury and its treatment. They may perform surgery on the brain. They will work with other doctors if the patient is in intensive care or has injuries to other parts of the body.   Nurses: Nurses check patient's vitals (temperature, blood pressure, heart and breathing rate) and watch for changes in strength and thinking. They help with daily cares such as eating and bathing. Nurses also coordinate care among the members of the health care team.   Social Workers: Social workers provide emotional support to help the patient and family adjust to being in the hospital. They coordinate discharge planning, referral to community resources, and answer questions about insurance or disability.   Physical Therapists (PT): Physical therapists evaluate and treat weaknesses in the patient's strength, flexibility, balance, rolling, sitting, standing and walking. Treatment may include exercises or instruction in use of equipment such as walkers, canes, or wheelchairs.   Occupational Therapists (OT):  Occupational therapists evaluate the patient's ability to perform dressing, bathing, homemaking and activities that require memory and organization. They provide treatment or equipment needed for safe independent living.   Speech Therapists: Speech therapists test and treat speech, language, thinking and swallowing problems.   Neuropsychologists: Neuropsychologists test thinking, memory, judgment, emotions, behavior and personality. This information can be used to help guide treatment. It will also help determine the amount of supervision that the patient needs when they leave the hospital.   Dieticians: Dieticians assess nutritional needs. They work with the patient and other team members to help the patient meet their nutritional goals.   Other staff members may work with the patient and family. These include:   Respiratory therapists.   Activity Therapists.   Clergy.   Child Life Therapists.   Patient Representatives.   Vocational Counselors.   Music Therapists.   Recreation therapists.  HOW WILL YOU REACT? When a friend or family member is hospitalized with a brain injury, it is normal for you to have many different emotions. These emotions will come and go at different times.   Panic and Fear: Panic and fear are common reactions after a family member has a brain injury. Fears are intense because you are worried the patient may not survive. Until the patient becomes  medically stable, your physical and emotional signs of panic may continue. These symptoms include rapid breathing, inability to sleep, decreased appetite and upset stomach. Some people may cry uncontrollably.   Shock and Denial: You may feel that what is happening is not real. You may notice things going on around you, but have trouble remembering information and conversations or meetings with others. You may also have a hard time understanding the seriousness of the injury that has occurred.   Anger: Many people feel  angry that they or their loved ones are in this situation. This may be justified. You may be angry with the patient for putting themselves in a situation where they could be hurt. You may also be angry with family members, friends, or others involved in the accident. You may be upset with the health care team for not doing or saying what you think is right. These are all normal reactions.   Guilt: Guilt is also a normal reaction. You may feel you could have done something to prevent the accident from happening, even when this may be far from true. You may also think about past events and personal experiences with the patient that you wish could have been different or better. If you are feeling angry with the patient, you may also feel guilty about your anger. We encourage you to talk about your feelings with someone close to you or a professional staff member.   Isolation: During this time you may feel distant from others. In this strange situation, you may have a hard time relating to others. You may think that others will not understand. You may isolate yourself because you think others are scared or disapprove of your feelings. However, a crisis such as a brain injury is a time when it is helpful to accept comfort, support and assistance from others.   Hope: As the patient begins to stabilize, your anxiety about survival will be combined with hope of recovery. Medical complications and slow recovery may increase anxiety. However, hope may be brought about by the smallest changes.  Any of these emotions are normal reactions to a very stressful situation. You may find it helpful to discuss your feelings with friends, family, or the health care team. It may also be useful to write about your feelings and experiences in a daily journal.  WHAT ARE SUGGESTIONS FOR COPING? People cope with stressful situations in different ways. What works for one person may not be helpful to another. We hope some of these  suggestions will help you get through this difficult time.   Write important information down in a journal or notebook. You can use this to keep track of questions you want to ask members of the health care team. It may also be useful to share with the patient.   Establish a "phone tree." Name one person for family and friends to call for information on the patient's condition.   Rotate family visits. If you need or want to leave the hospital, you may want to have someone stay with the patient so you can feel reassured the patient is not alone.   When someone offers to help, accept the offer. Try to be specific about how this person can help.   Express your feelings. Discuss your positive and negative feelings with family members, friends, and staff.   Be kind to yourself. Take time for a walk or have a meal with a friend. Also, try to leave the hospital for a meal or  restful night of sleep. It is very important to take care of yourself. By taking care of your own needs, you will be more prepared to make good decisions and support your loved one.  WHAT ARE THE SIGNS OF IMPROVEMENT? "How long will it take for my loved one to get better?" "What will he or she be like?" Your health care team may have a hard time answering these questions. Age, extent of damage, length of time since injury, and past mental and physical health of the patient are factors used when predicting the extent of recovery.  Most patients follow a general pattern of recovery after a severe brain injury. This pattern is divided into stages. It is important to know each patient is different and may not follow the stages exactly. Patients vary in the amount of time spent at each stage and their recovery may stop at any stage. Recovery may be grouped into the following four stages: Stage 1: Unresponsiveness: During this stage the patient does not respond consistently or appropriately. You may hear this stage referred to as a coma. You  may notice different movements in the patient. These are referred to as reflexive or generalized responses.  Decerebrate: A reflex that causes straightening of the arms and legs.   Decorticate: A reflex that causes bending of the arms and straightening of the legs.   Generalized responses: Random movement of the arms and legs for no specific reason.  Stage 2: Early Responses: During this stage the patient starts to respond to things that are happening to them. The responses will be more appropriate, but may be inconsistent or slow. The patient will start to have localized responses and follow simple commands. Some examples of early responses to watch for are: Localized response: These are appropriate movements by the patient in response to sound, touch, or sight. Turning toward a sound, pulling away from something uncomfortable, or following movement with the eyes are examples.  Following simple commands: Opening and closing eyes, sticking the tongue out, or gripping and releasing hands when asked are examples. Stage 3. Agitated and Confused: At this stage the patient is responding more consistently. The patient will be confused about where he or she is and what has happened. The patient will have difficulty with memory and behavior. The patient's confusion may lead to yelling, swearing, biting, or striking out. Do not be alarmed if soft wrist and ankle ties are used to protect the patient and prevent tubes from being pulled out. It is very important to remember this stage is a step toward recovery and this behavior is not intentional. Stage 4. Higher Level Responses: The patient completes routine tasks without difficulty, but still needs help with problem solving, making judgments and decisions. The patient may not understand his or her limitations and safety is a big concern. Unusual or high stress situations make activities more difficult. The patient may seem more like the person you knew before.  However, there may be personality changes. Unfortunately, there is no way to predict how long a person will remain in one stage or what the final outcome will be. The team will work during the hospital stay to achieve the best possible outcome.  HOW CAN YOU HELP WITH RECOVERY? The family and friends of a person with a brain injury are important members of the team. Your knowledge about the patient's emotional and physical needs is valuable, and so is your participation in helping take care of these needs. The following are suggestions  for things you can do that correspond with the stages of recovery. Stage 1. Unresponsive Stage  At this stage the patient appears to be in a deep sleep and does not respond to their surroundings. The goal is to obtain a response to touch, sound, sight or smell.   When speaking to the patient, assume he or she understands what you are saying. Speak in a comforting, positive and familiar way.   Speak clearly and slowly about familiar people and memories.   When visitors are present, focus on the patient. Keep the number of visitors to 1 or 2 people at a time. Visits should be short. Other distractions (TV, radio) should be turned off when visiting.   Provide the patient with pictures, music, and personal items that are comforting and familiar. Use poster board or a bulletin board near the bed.   The nurses and therapists may encourage you to assist in care of the patient. You may be asked to help with hair care, shaving, applying skin lotion or gently stretching and positioning the patient's arms and legs. If you do not feel comfortable with these activities, that is okay. The staff will understand.  Stage 2. Early Responses  At this stage the patient is beginning to respond to people and hospital surroundings. The responses may range from turning toward a familiar voice to moving an arm or leg on request.   The goal is to increase the consistency of responses.    There may be a delayed response time when asking the patient to move, speak, or pay attention. Always wait 1-2 minutes for the requested response. Repeat your request only a couple of times during this time period.   Be aware that the patient's attention span may only be 5-10 minutes before fatigue and frustration set in.   Allow for rest periods. Turn off the TV, music, and lights, and limit visitors. The patient can become stressed by too much noise, light or stimulation.   Continue with suggestions listed in the "unresponsive" stage.  Stage 3. Agitated and Confused Responses  During this stage, things are confusing. The patient may begin to remember past events but may be unsure of surroundings and the reason for hospitalization. The goal is to help the patient become oriented and to continue to treat his or her physical needs. Provide one activity at a time and expect the patient to pay attention for only short periods. Keeping the noise level low helps the patient focus.   The patient may repeat a word, phrase, or activity over and over. Try to interest the patient in a different activity.   The patient may do socially unacceptable things during this time, such as swearing or hitting. This is common. Calmly tell the patient the behavior is not appropriate.   Help orient the patient to his or her surroundings with both visual and verbal information (such as the suggestions below). Remembering information from one time to another is difficult.   A calendar with the days marked off.   A sign in the room telling them where they are.   A posted schedule with meal times, therapies, and special appointments.   To decrease frustration, allow the patient to move about with supervision.  Stage 4. Higher Level Responses  At this stage the patient is able to take part in daily routines with help for problem solving, making judgments, and decisions. Most of the suggestions from the previous  stage continue to apply here. The goal  is to decrease the amount of supervision needed and increase independence.   Help make the environment safe. Safety decisions may still be difficult for the patient to make. Praise safe decisions and give calm explanations about unsafe decisions. Learning is still difficult.   Encourage the use of memory aids, such as a date book, to help with appointments and daily routines.   Encourage brief rest periods because the patient will continue to need more rest.   Check with the health care team on activities that may be completed with or without supervision. These activities may include work or school re-entry, taking medications, driving, or managing money.  WHAT HAPPENS AFTER ACUTE CARE?  Discharge Planning: Early in the hospital stay, the social worker will meet with the patient and family to start discharge planning. Many patients will need care or therapy after they leave acute care. Some patients will be discharged to a nursing facility, while others will be discharged to their homes.   Discharge to a Nursing Facility: There are several types of nursing facilities.   Skilled care provides extensive nursing care and daily therapy. Many patients will start with skilled care and then move to acute rehabilitation care, intermediate care, or residential care.   Acute rehabilitation provides an inpatient program of intense therapy in a hospital. The patient will need to actively participate in 3 to 6 hours of therapy per day (i.e. physical, occupational, speech, and activity therapy).   Intermediate care provides less extensive nursing care and therapy. Residential care is for patients who are fairly independent and do not need routine nursing care or therapies.   Discharge to Home: If the patient goes home, they may still need therapy or other care. Some of the options are:   Outpatient therapy is provided at hospitals, clinics and some nursing homes.  Outpatient physical therapy will help build up strength and endurance. Outpatient occupational, speech and cognitive therapy may also be needed. Family or friends may need to arrange transportation for therapy appointments.   Home health care programs are available in many communities. Some of the services they offer include in-home nursing care, homemaker and health aides, meals-on-wheels, adult day care, home therapy visits, medical equipment rental/purchase, and transportation.  MAKING THE CHOICE During the acute care hospital stay, the health care team will make recommendations about the level of care the patient will need at discharge. Then the patient and family will choose the agency that provides the services. Your choice may be based on insurance coverage, location, the patient's potential for recovery, and the patient's or family's feelings about the services provided. Some facilities will come to the hospital to see the patient and their medical record before they decide to accept the patient. If accepted, the date for transfer or discharge will be set.  Document Released: 02/11/2005 Document Revised: 01/31/2011 Document Reviewed: 03/10/2006 [facility] Patient Information 2012 [place], [place].

## 2011-05-23 NOTE — Progress Notes (Signed)
Patient ID: James Harmon, male   DOB: 06/01/1950, 61 y.o.   MRN: [id number] Postoperative day 1 open reduction internal fixation right distal radius fracture. Patient is resting comfortably the dressing is clean and dry. Patient may be discharged to home from an orthopedic standpoint at this time I will followup with him in 2 weeks. Patient is given instructions for range of motion of his fingers but no lifting no pulling or pushing with the hand.

## 2011-05-23 NOTE — Progress Notes (Signed)
Patient ID: James Harmon, male   DOB: 19-Sep-1950, 61 y.o.   MRN: [id number] Neuro stable.no weakness. Ok to discharge. i will see him in my office in 2 to 3 weeks or before prn

## 2011-05-23 NOTE — Discharge Summary (Signed)
Aris Moman, MD, MPH, FACS Pager: [phone]  

## 2011-05-23 NOTE — Progress Notes (Signed)
Clinical Social Work Department BRIEF PSYCHOSOCIAL ASSESSMENT 05/22/2011  Patient:  James Harmon,James Harmon     Account Number:  [id number]     Admit date:  05/21/2011  Clinical Social Worker:  Pearson Forster  Date/Time:  05/22/2011 10:45 AM  Referred by:  Physician  Date Referred:  05/22/2011 Referred for  Psychosocial assessment   Other Referral:   Interview type:  Patient Other interview type:   Patient family at bedside    PSYCHOSOCIAL DATA Living Status:  WIFE Admitted from facility:   Level of care:   Primary support name:  James Harmon [phone] Primary support relationship to patient:  SPOUSE Degree of support available:   Strong    CURRENT CONCERNS Current Concerns  None Noted   Other Concerns:   SBIRT Completion    SOCIAL WORK ASSESSMENT / PLAN Clinical Social Worker spoke with patient and patient wife at bedside to discuss patient accident and begin discharge plans.  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  Patient states that he lives at home with his wife - both work from home.  Patient with a lot of support at home and 24 hour assistance if needed at discharge.    Patient is expected to have surgery on his right wrist today and then will work with PT/OT prior to discharge. CSW to complete SBIRT with patient at a later date per patient request.  CSW remains available for continued support and any discharge planning needs.   Assessment/plan status:  Psychosocial Support/Ongoing Assessment of Needs Other assessment/ plan:   SBIRT Completion   Information/referral to community resources:   None at this time    PATIENT'S/FAMILY'S RESPONSE TO PLAN OF CARE: Patient and patient wife appreciative of CSW involvement and feel very prepared for patient to safely return home at discharge.  Patient was anxious about upcoming surgery and hopeful that it would decrease the amount of pressure and pounding he was feeling at the time.     [address] [place], [place] [phone]

## 2011-05-23 NOTE — Progress Notes (Signed)
Patient ID: James Harmon, male   DOB: [birth year], 61 y.o.   MRN: [id number]   LOS: 2 days   Subjective: C/o pain in the right wrist, otherwise pain is tolerable, but he does have a frontal HA. Has not been up yet this am, but feels like he should do well enough to go home later today.   Objective: Vital signs in last 24 hours: Temp:  [98.6 F (37 C)-100.9 F (38.3 C)] 100.7 F (38.2 C) (03/28 0509) Pulse Rate:  [80-96] 90  (03/28 0509) Resp:  [15-22] 18  (03/28 0509) BP: (122-147)/(62-82) 126/73 mmHg (03/28 0509) SpO2:  [86 %-100 %] 95 % (03/28 0509) Last BM Date: 05/21/11  Lab Results:  CBC  Basename 05/22/11 0425 05/21/11 1247  WBC 9.7 6.4  HGB 12.7* 14.4  HCT 36.3* 42.1  PLT 258 292   BMET  Basename 05/22/11 0425 05/21/11 1247  NA 137 137  K 3.6 3.7  CL 105 100  CO2 24 28  GLUCOSE 141* 135*  BUN 15 22  CREATININE 0.70 0.99  CALCIUM 8.3* 9.2    General appearance: alert and no distress Resp: clear to auscultation bilaterally Cardio: regular rate and rhythm GI: normal findings: bowel sounds normal and soft, non-tender Extremities: NVI Incision/Wound:Wounds clean, scabbing over.  Neuro: Alert and oriented x 3.   Assessment/Plan: Fall TBI w/SDH -- per Dr. Jeral Fruit follow up in 2-3 wks Multiple facial fxs/lacerations -- Nonoperative Right wrist fx -- ORIF per Dr. Lajoyce Corners and follow up in 2 wks ABL anemia -- Mild FEN -- Tolerating po  VTE -- SCD's Dispo -- Home later today.   Will give Vitamin K as was started on Coumadin last pm after orthopedic surgery.    James Harmon Pager [phone] General Trauma Pager [phone]   05/23/2011

## 2011-05-30 ENCOUNTER — Telehealth (INDEPENDENT_AMBULATORY_CARE_PROVIDER_SITE_OTHER): Payer: Self-pay | Admitting: General Surgery

## 2011-05-30 ENCOUNTER — Encounter (INDEPENDENT_AMBULATORY_CARE_PROVIDER_SITE_OTHER): Payer: Self-pay | Admitting: Physician Assistant

## 2011-05-30 ENCOUNTER — Ambulatory Visit (INDEPENDENT_AMBULATORY_CARE_PROVIDER_SITE_OTHER): Payer: Worker's Compensation | Admitting: Physician Assistant

## 2011-05-30 VITALS — BP 122/84 | HR 76 | Temp 98.4°F | Resp 16 | Ht 69.0 in | Wt 168.0 lb

## 2011-05-30 DIAGNOSIS — S0181XA Laceration without foreign body of other part of head, initial encounter: Secondary | ICD-10-CM

## 2011-05-30 DIAGNOSIS — S0180XA Unspecified open wound of other part of head, initial encounter: Secondary | ICD-10-CM

## 2011-05-30 NOTE — Telephone Encounter (Signed)
Patient seen in trauma clinic today. Pt brought a fax coversheer from [employer]. Lamar Benes ([place] [employer] Case Mgr) requesting to be contacted for prior authorization before seeing Mr. Osburn for any follow up appointments or procedures. Info forwarded to Aram Beecham ([facility]).

## 2011-05-30 NOTE — Progress Notes (Signed)
Patient ID: James Harmon, male   DOB: 07/20/1950, 61 y.o.   MRN: [id number] Hospital Course: Chief Complaint: Multitrauma  HPI: He was up on a latter 13 feet and fell onto his face and right hand. He was taken by private vehicle the was a long hospital where he was evaluated. He was found to have multiple facial fractures and facial lacerations, a small amount of subdural blood, and a right wrist fracture. He denies loss of consciousness. He denies neck pain. He denies chest pain or shortness of breath. He denies abdominal pain or back pain. He reports right wrist pain and some facial pain.  His facial lacerations were sutured by the ED staff.  He was admitted and seen by the multiple consultants listed below.  It was felt he could be treated conservatively from the standpoint of his subdural hematoma and he did well with regards to this. He did receive a dose of warfarin after his wrist surgery on 05/22/11 and therefore was given Vitamin K 10mg  IV x 1 prior to discharge. This was discussed with neurosurgery and as the patient was doing well clinically, it was felt his could be safely discharged home with family.  He underwent ORIF of the right wrist fracture on 05/22/11 as noted per Dr. Lajoyce Corners and he will follow up with Dr. Lajoyce Corners in 2 wk.  Dr Annalee Genta recommended follow up in 10-14 days for his multiple facial fractures including fracture through both anterior and posterior walls of the left frontal sinus and bilateral displaced nasal fractures. These will be re-evaluated at his follow up visit . He was discharged on Augmentin.  He was ambulatory and tolerating his diet and was deemed stable for discharge today.   He is seen in follow up today for suture removal .  He has been doing very well at home with little pain. He has appointments to see Dr. Annalee Genta and Dr. Lajoyce Corners in follow up already  PE: Gen : Alert and oriented x 3. WN WD  Lungs- clear  Heart- RRR  Facial lacs healing over forehead, upper lip and  chin- all sutures removed without difficulty  Impression: Fall from ladder with multiple trauma Plan- follow up with Trauma prn

## 2011-05-30 NOTE — Patient Instructions (Signed)
Avoid sun exposure to sites of lacerations and use sunscreen as instructed.  Call for questions [phone]

## 2011-12-09 ENCOUNTER — Other Ambulatory Visit: Payer: Self-pay | Admitting: Internal Medicine

## 2011-12-09 NOTE — Telephone Encounter (Signed)
Patients chart is at the nurses station in the pa pool pile.  [facility] [id number]

## 2013-03-03 ENCOUNTER — Ambulatory Visit: Payer: Self-pay | Admitting: Family Medicine

## 2013-03-03 VITALS — BP 140/80

## 2013-03-03 NOTE — Progress Notes (Signed)
[facility] [address] [place] [address] [phone]  Date:  03/03/2013   Name:  James Harmon   DOB:  December 11, 1950   MRN:  [id number]  PCP:  Kennon Portela, MD    Chief Complaint: No chief complaint on file.   History of Present Illness:  James Harmon is a 63 y.o. 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  Needs a recheck BP today.  He had noted normal BP reading at home since he stopped his lisinopril about one month ago.  Planned to recheck today in case his high reading 2 days ago was transient.    Patient Active Problem List   Diagnosis Date Noted  . Fall from ladder 05/22/2011  . SDH  05/22/2011  . Frontal sinus fracture 05/22/2011  . Frontal skull fracture 05/22/2011  . Bilateral orbit fractures 05/22/2011  . Nasal bone fractures 05/22/2011  . Multiple facial lacerations 05/22/2011  . Right distal radius fracture 05/22/2011  . Acute blood loss anemia 05/22/2011    Past Medical History  Diagnosis Date  . Hypertension   . Fall from ladder     Past Surgical History  Procedure Laterality Date  . Fracture surgery  05/22/11    right wrist    History  Substance Use Topics  . Smoking status: Never Smoker   . Smokeless tobacco: Not on file  . Alcohol Use: No    No family history on file.  No Known Allergies  Medication list has been reviewed and updated.  Current Outpatient Prescriptions on File Prior to Visit  Medication Sig Dispense Refill  . lisinopril (PRINIVIL,ZESTRIL) 10 MG tablet Take 1 tablet (10 mg total) by mouth daily. Needs office visit  30 tablet  0  . lisinopril (PRINIVIL,ZESTRIL) 5 MG tablet Take 5 mg by mouth daily.      Marland Kitchen neomycin-bacitracin-polymyxin (NEOSPORIN) OINT Apply 1 application topically daily.      . sodium chloride (OCEAN) 0.65 % SOLN nasal spray Place 4 sprays into the nose 4 (four) times daily.       No  current facility-administered medications on file prior to visit.    Review of Systems:  As per HPI- otherwise negative.   Physical Examination: There were no vitals filed for this visit. There were no vitals filed for this visit. There is no weight on file to calculate BMI. Ideal Body Weight:    Recheck BP today was 140/80  GEN: WDWN, NAD, Non-toxic, Alert & Oriented x 3 HEENT: Atraumatic, Normocephalic.  Ears and Nose: No external deformity. EXTR: No clubbing/cyanosis/edema NEURO: Normal gait.  PSYCH: Normally interactive. Conversant. Not depressed or anxious appearing.  Calm demeanor.    Assessment and Plan: Elevated blood pressure  Given a 1 year card today. He has a long history of trace hematuria- this has been evaluated by urology in the past Signed Lamar Blinks, MD

## 2013-03-03 NOTE — Patient Instructions (Signed)
Your BP is ok today- 140/80. You can have a one year DOT card.  Continue to keep an eye on your pressure at home  You did have trace blood on your urine dip.  Please follow-up with your regular doctor at your convenience.

## 2014-02-28 ENCOUNTER — Ambulatory Visit (INDEPENDENT_AMBULATORY_CARE_PROVIDER_SITE_OTHER): Payer: Self-pay | Admitting: Family Medicine

## 2014-02-28 VITALS — BP 135/76 | HR 89 | Temp 98.1°F | Resp 16 | Ht 67.0 in | Wt 158.0 lb

## 2014-02-28 DIAGNOSIS — Z0000 Encounter for general adult medical examination without abnormal findings: Secondary | ICD-10-CM

## 2014-02-28 NOTE — Patient Instructions (Signed)
Good to see you today!  You did have a little blood in your urine on your urine test today.  Let's recheck this the next time we see you

## 2014-02-28 NOTE — Progress Notes (Signed)
[facility] [address], [place] [address] [phone]  Date:  02/28/2014   Name:  James Harmon   DOB:  08/15/1950   MRN:  [id number]  PCP:  Kennon Portela, MD    Chief Complaint: DOT physical   History of Present Illness:  James Harmon is a 64 y.o. very pleasant male patient who presents with the following:  Here today for a DOT.  He is on medication for HTN.  He has chronic microhematuria- this has been evaluated  Patient Active Problem List   Diagnosis Date Noted  . Fall from ladder 05/22/2011  . SDH  05/22/2011  . Frontal sinus fracture 05/22/2011  . Frontal skull fracture 05/22/2011  . Bilateral orbit fractures 05/22/2011  . Nasal bone fractures 05/22/2011  . Multiple facial lacerations 05/22/2011  . Right distal radius fracture 05/22/2011  . Acute blood loss anemia 05/22/2011    Past Medical History  Diagnosis Date  . Hypertension   . Fall from ladder     Past Surgical History  Procedure Laterality Date  . Fracture surgery  05/22/11    right wrist    History  Substance Use Topics  . Smoking status: Never Smoker   . Smokeless tobacco: Not on file  . Alcohol Use: No    No family history on file.  No Known Allergies  Medication list has been reviewed and updated.  Current Outpatient Prescriptions on File Prior to Visit  Medication Sig Dispense Refill  . neomycin-bacitracin-polymyxin (NEOSPORIN) OINT Apply 1 application topically daily. (Patient not taking: Reported on 02/28/2014)    . sodium chloride (OCEAN) 0.65 % SOLN nasal spray Place 4 sprays into the nose 4 (four) times daily. (Patient not taking: Reported on 02/28/2014)     No current facility-administered medications on file prior to visit.    Review of Systems:  As per HPI- otherwise negative.   Physical Examination: Filed Vitals:   02/28/14 0945  BP: 135/76  Pulse:   Temp:   Resp:    Filed Vitals:   02/28/14 0924  Height: 5\' 7"  (1.702 m)  Weight: 158 lb  (71.668 kg)   Body mass index is 24.74 kg/(m^2). Ideal Body Weight: Weight in (lb) to have BMI = 25: 159.3  GEN: WDWN, NAD, Non-toxic, A & O x 3 HEENT: Atraumatic, Normocephalic. Neck supple. No masses, No LAD.  Bilateral TM wnl, oropharynx normal.  PEERL,EOMI.   Ears and Nose: No external deformity. CV: RRR, No M/G/R. No JVD. No thrill. No extra heart sounds. PULM: CTA B, no wheezes, crackles, rhonchi. No retractions. No resp. distress. No accessory muscle use. ABD: S, NT, ND. No rebound. No HSM. EXTR: No c/c/e NEURO Normal gait.  PSYCH: Normally interactive. Conversant. Not depressed or anxious appearing.  Calm demeanor.  Normal strength and DTR all extremities, negative for inguinal hernia  Assessment and Plan: Physical exam  DOT- 1 year card due to controlled HTN  Signed Lamar Blinks, MD

## 2015-02-27 ENCOUNTER — Ambulatory Visit (INDEPENDENT_AMBULATORY_CARE_PROVIDER_SITE_OTHER): Payer: Self-pay | Admitting: Emergency Medicine

## 2015-02-27 VITALS — BP 126/84 | HR 76 | Temp 98.2°F | Resp 18 | Ht 68.25 in | Wt 160.0 lb

## 2015-02-27 DIAGNOSIS — Z0289 Encounter for other administrative examinations: Secondary | ICD-10-CM

## 2015-02-27 DIAGNOSIS — Z0000 Encounter for general adult medical examination without abnormal findings: Secondary | ICD-10-CM

## 2015-02-27 NOTE — Progress Notes (Deleted)
      Chief Complaint:  Chief Complaint  Patient presents with  . Employment Physical    DOT PE    HPI: James Harmon is a 65 y.o. male who reports to [facility] today requesting a DOT exam  Past Medical History  Diagnosis Date  . Hypertension   . Fall from ladder    Past Surgical History  Procedure Laterality Date  . Fracture surgery  05/22/11    right wrist   Social History   Social History  . Marital Status: Married    Spouse Name: N/A  . Number of Children: N/A  . Years of Education: N/A   Social History Main Topics  . Smoking status: Never Smoker   . Smokeless tobacco: None  . Alcohol Use: No  . Drug Use: No  . Sexual Activity: Not Asked   Other Topics Concern  . None   Social History Narrative   History reviewed. No pertinent family history. No Known Allergies Prior to Admission medications   Medication Sig Start Date End Date Taking? Authorizing Provider  lisinopril (PRINIVIL,ZESTRIL) 5 MG tablet Take 5 mg by mouth daily.   Yes Historical Provider, MD  neomycin-bacitracin-polymyxin (NEOSPORIN) OINT Apply 1 application topically daily. Patient not taking: Reported on 02/28/2014 05/23/11   Shawn M Rayburn, PA-C  sodium chloride (OCEAN) 0.65 % SOLN nasal spray Place 4 sprays into the nose 4 (four) times daily. Patient not taking: Reported on 02/28/2014 05/23/11   Shawn M Rayburn, PA-C     ROS: The patient denies fevers, chills, night sweats, unintentional weight loss, chest pain, palpitations, wheezing, dyspnea on exertion, nausea, vomiting, abdominal pain, dysuria, hematuria, melena, numbness, weakness, or tingling. ***  All other systems have been reviewed and were otherwise negative with the exception of those mentioned in the HPI and as above.    PHYSICAL EXAM: Filed Vitals:   02/27/15 1053  BP: 126/84  Pulse: 76  Temp: 98.2 F (36.8 C)  Resp: 18   Body mass index is 24.14 kg/(m^2).   General: Alert, no acute distress HEENT:  Normocephalic, atraumatic,  oropharynx patent. Eye: [name] [facility] Cardiovascular:  Regular rate and rhythm, no rubs murmurs or gallops.  No Carotid bruits, radial pulse intact. No pedal edema.  Respiratory: Clear to auscultation bilaterally.  No wheezes, rales, or rhonchi.  No cyanosis, no use of accessory musculature Abdominal: No organomegaly, abdomen is soft and non-tender, positive bowel sounds.  No masses. Musculoskeletal: Gait intact. No edema, tenderness Skin: No rashes. Neurologic: Facial musculature symmetric. Psychiatric: Patient acts appropriately throughout our interaction. Lymphatic: No cervical or submandibular lymphadenopathy    LABS:    EKG/XRAY:   Primary read interpreted by Dr. Everlene Farrier at [facility].   ASSESSMENT/PLAN: Patient qualifies for a 1 year DOT. He is restricted to lenses. He is on blood pressure medication.I personally performed the services described in this documentation, which was scribed in my presence. The recorded information has been reviewed and is accurate.   Gross sideeffects, risk and benefits, and alternatives of medications d/w patient. Patient is aware that all medications have potential sideeffects and we are unable to predict every sideeffect or drug-drug interaction that may occur.  Arlyss Queen MD 02/27/2015 11:24 AM

## 2015-02-27 NOTE — Progress Notes (Signed)
Patient ID: James Harmon, male   DOB: [birth year], 65 y.o.   MRN: [id number]     By signing my name below, I, James Harmon, attest that this documentation has been prepared under the direction and in the presence of James Queen, MD.  Electronically Signed: Zola Harmon, Medical Scribe. 02/27/2015. 11:15 AM.   Chief Complaint:  Chief Complaint  Patient presents with   Employment Physical    DOT PE    HPI: James Harmon is a 65 y.o. male with a history of hypertension who reports to [facility] today for a DOT physical exam. He takes lisinopril 5 mg. He does not eat much salt in his diet. Patient sustained multiple fractures to his head and right distal radius after he walked off a scaffold and fell 10-12 feet 4-5 years ago. He does wear corrective lenses.  Patient went to [place] over the holidays.   Past Medical History  Diagnosis Date   Hypertension    Fall from ladder    Past Surgical History  Procedure Laterality Date   Fracture surgery  05/22/11    right wrist   Social History   Social History   Marital Status: Married    Spouse Name: N/A   Number of Children: N/A   Years of Education: N/A   Social History Main Topics   Smoking status: Never Smoker    Smokeless tobacco: None   Alcohol Use: No   Drug Use: No   Sexual Activity: Not Asked   Other Topics Concern   None   Social History Narrative   History reviewed. No pertinent family history. No Known Allergies Prior to Admission medications   Medication Sig Start Date End Date Taking? Authorizing Provider  lisinopril (PRINIVIL,ZESTRIL) 5 MG tablet Take 5 mg by mouth daily.   Yes Historical Provider, MD  neomycin-bacitracin-polymyxin (NEOSPORIN) OINT Apply 1 application topically daily. Patient not taking: Reported on 02/28/2014 05/23/11   James M Rayburn, PA-C  sodium chloride (OCEAN) 0.65 % SOLN nasal spray Place 4 sprays into the nose 4 (four) times daily. Patient not taking: Reported on 02/28/2014 05/23/11   James M  Rayburn, PA-C     ROS: The patient denies fevers, chills, night sweats, unintentional weight loss, chest pain, palpitations, wheezing, dyspnea on exertion, nausea, vomiting, abdominal pain, dysuria, hematuria, melena, numbness, weakness, or tingling.   All other systems have been reviewed and were otherwise negative with the exception of those mentioned in the HPI and as above.    PHYSICAL EXAM: Filed Vitals:   02/27/15 1053  BP: 126/84  Pulse: 76  Temp: 98.2 F (36.8 C)  Resp: 18   Body mass index is 24.14 kg/(m^2).   General: Alert, no acute distress HEENT:  Normocephalic, oropharynx patent. Healed scar over the mid forehead.  Eye: James Harmon James [facility]on Cardiovascular:  Regular rate and rhythm, no rubs murmurs or gallops.  No Carotid bruits, radial pulse intact. No pedal edema.  Respiratory: Clear to auscultation bilaterally.  No wheezes, rales, or rhonchi.  No cyanosis, no use of accessory musculature Abdominal: 1 cm umbilical hernia. Musculoskeletal: Gait intact. No edema, tenderness Skin: No rashes. Neurologic: Facial musculature symmetric. Psychiatric: Patient acts appropriately throughout our interaction. Lymphatic: No cervical or submandibular lymphadenopathy Genitourinary: Bilobed left testicle.   LABS:    EKG/XRAY:   Primary read interpreted by James [facility] at [facility].   ASSESSMENT/PLAN: Patient qualifies for a 1 year DOT. He is restricted to lenses. He is on blood pressure medication.I personally performed the services described in  this documentation, which was scribed in my presence. The recorded information has been reviewed and is accurate.     Gross sideeffects, risk and benefits, and alternatives of medications d/w patient. Patient is aware that all medications have potential sideeffects and we are unable to predict every sideeffect or drug-drug interaction that may occur.  James Queen MD 02/27/2015 11:15 AM

## 2016-02-27 ENCOUNTER — Ambulatory Visit (INDEPENDENT_AMBULATORY_CARE_PROVIDER_SITE_OTHER): Payer: Self-pay | Admitting: Physician Assistant

## 2016-02-27 VITALS — BP 142/76 | HR 75 | Temp 97.0°F | Resp 16 | Ht 68.0 in | Wt 163.0 lb

## 2016-02-27 DIAGNOSIS — Z0289 Encounter for other administrative examinations: Secondary | ICD-10-CM

## 2016-02-27 NOTE — Progress Notes (Signed)
Games developer Medical Examination   James Harmon is a 66 y.o. male who presents today for a commercial driver fitness determination physical exam. He has a history of HTN. The patient reports no problems today. In the past the patient reports receiving 1 year certificates. He  denies focal neurological deficits, vision and hearing changes. He denies the habitual use of benzodiazepines, opioids, and amphetamines.  Past Medical History:  Diagnosis Date  . Fall from ladder   . Hypertension     Current medications, family history, allergies, social history reviewed by me and exist elsewhere in the encounter.   Review of Systems  Constitutional: Negative for fever.  HENT: Negative for hearing loss and sore throat.   Cardiovascular: Negative for chest pain and leg swelling.  Neurological: Negative for dizziness.    Objective:     Vision/hearing:  Visual Acuity Screening   Right eye Left eye Both eyes  Without correction:     With correction: 20/20 20/20 20/20   Comments: Peripheral Vision: Right eye 85 degrees. Left eye 85 degrees.  The patient can distinguish the colors red, amber and green.  Hearing Screening Comments: Whisper test was 39ft in both eyes  Applicant can recognize and distinguish among traffic control signals and devices showing standard red, green, and amber colors.  Corrective lenses required: Yes  Monocular Vision?: No  Hearing aid requirement: No  Physical Exam  Constitutional: He is oriented to person, place, and time. He appears well-developed and well-nourished. No distress.  HENT:  Mouth/Throat: Oropharynx is clear and moist. No oropharyngeal exudate.  Cardiovascular: Normal rate and regular rhythm.   Pulmonary/Chest: Effort normal and breath sounds normal.  Musculoskeletal: Normal range of motion.  Neurological: He is alert and oriented to person, place, and time.  Skin: He is not diaphoretic.    BP (!) 142/76 (BP Location: Right Arm, Patient  Position: Sitting, Cuff Size: Normal)   Pulse 75   Temp 97 F (36.1 C)   Resp 16   Ht 5\' 8"  (1.727 m)   Wt 163 lb (73.9 kg)   SpO2 97%   BMI 24.78 kg/m   Labs: Comments: SG: 1.030 , PRO: Neg , BLO: Moderate , GLU: Neg  Assessment:    Healthy male exam.  Meets standards, but periodic monitoring required due to history of HTN.  Driver qualified only for 1 year.    Plan:    Medical examiners certificate completed and printed. Return as needed.

## 2016-02-27 NOTE — Patient Instructions (Signed)
     IF you received an x-ray today, you will receive an invoice from [facility]. Please contact  Radiology at [phone] with questions or concerns regarding your invoice.   IF you received labwork today, you will receive an invoice from [facility]. Please contact [facility] at [phone] with questions or concerns regarding your invoice.   Our billing staff will not be able to assist you with questions regarding bills from these companies.  You will be contacted with the lab results as soon as they are available. The fastest way to get your results is to activate your My Chart account. Instructions are located on the last page of this paperwork. If you have not heard from us regarding the results in 2 weeks, please contact this office.     

## 2020-07-06 ENCOUNTER — Other Ambulatory Visit: Payer: Self-pay

## 2020-07-06 ENCOUNTER — Emergency Department (HOSPITAL_COMMUNITY)
Admission: EM | Admit: 2020-07-06 | Discharge: 2020-07-06 | Disposition: A | Discharge status: Home / Self Care | Payer: Medicare Other | Attending: Emergency Medicine | Admitting: Emergency Medicine

## 2020-07-06 ENCOUNTER — Encounter (HOSPITAL_COMMUNITY): Payer: Self-pay

## 2020-07-06 ENCOUNTER — Emergency Department (HOSPITAL_COMMUNITY): Payer: Medicare Other

## 2020-07-06 DIAGNOSIS — N200 Calculus of kidney: Secondary | ICD-10-CM | POA: Diagnosis not present

## 2020-07-06 DIAGNOSIS — N1330 Unspecified hydronephrosis: Secondary | ICD-10-CM | POA: Diagnosis not present

## 2020-07-06 DIAGNOSIS — R222 Localized swelling, mass and lump, trunk: Secondary | ICD-10-CM

## 2020-07-06 DIAGNOSIS — K5750 Diverticulosis of both small and large intestine without perforation or abscess without bleeding: Secondary | ICD-10-CM | POA: Diagnosis not present

## 2020-07-06 DIAGNOSIS — N2889 Other specified disorders of kidney and ureter: Secondary | ICD-10-CM | POA: Insufficient documentation

## 2020-07-06 DIAGNOSIS — R911 Solitary pulmonary nodule: Secondary | ICD-10-CM | POA: Insufficient documentation

## 2020-07-06 DIAGNOSIS — M47819 Spondylosis without myelopathy or radiculopathy, site unspecified: Secondary | ICD-10-CM | POA: Diagnosis not present

## 2020-07-06 DIAGNOSIS — I10 Essential (primary) hypertension: Secondary | ICD-10-CM | POA: Insufficient documentation

## 2020-07-06 DIAGNOSIS — R109 Unspecified abdominal pain: Secondary | ICD-10-CM | POA: Diagnosis not present

## 2020-07-06 LAB — I-STAT CHEM 8, ED
BUN: 16 mg/dL (ref 8–23)
Calcium, Ion: 1.06 mmol/L — ABNORMAL LOW (ref 1.15–1.40)
Chloride: 103 mmol/L (ref 98–111)
Creatinine, Ser: 0.7 mg/dL (ref 0.61–1.24)
Glucose, Bld: 107 mg/dL — ABNORMAL HIGH (ref 70–99)
HCT: 38 % — ABNORMAL LOW (ref 39.0–52.0)
Hemoglobin: 12.9 g/dL — ABNORMAL LOW (ref 13.0–17.0)
Potassium: 3.7 mmol/L (ref 3.5–5.1)
Sodium: 140 mmol/L (ref 135–145)
TCO2: 25 mmol/L (ref 22–32)

## 2020-07-06 LAB — CBC WITH DIFFERENTIAL/PLATELET
Abs Immature Granulocytes: 0.02 10*3/uL (ref 0.00–0.07)
Basophils Absolute: 0 10*3/uL (ref 0.0–0.1)
Basophils Relative: 0 %
Eosinophils Absolute: 0.2 10*3/uL (ref 0.0–0.5)
Eosinophils Relative: 2 %
HCT: 40.6 % (ref 39.0–52.0)
Hemoglobin: 13.6 g/dL (ref 13.0–17.0)
Immature Granulocytes: 0 %
Lymphocytes Relative: 12 %
Lymphs Abs: 0.9 10*3/uL (ref 0.7–4.0)
MCH: 30.4 pg (ref 26.0–34.0)
MCHC: 33.5 g/dL (ref 30.0–36.0)
MCV: 90.6 fL (ref 80.0–100.0)
Monocytes Absolute: 0.9 10*3/uL (ref 0.1–1.0)
Monocytes Relative: 12 %
Neutro Abs: 5.6 10*3/uL (ref 1.7–7.7)
Neutrophils Relative %: 74 %
Platelets: 260 10*3/uL (ref 150–400)
RBC: 4.48 MIL/uL (ref 4.22–5.81)
RDW: 13.8 % (ref 11.5–15.5)
WBC: 7.6 10*3/uL (ref 4.0–10.5)
nRBC: 0 % (ref 0.0–0.2)

## 2020-07-06 LAB — URINALYSIS, ROUTINE W REFLEX MICROSCOPIC
Bacteria, UA: NONE SEEN
Bilirubin Urine: NEGATIVE
Glucose, UA: NEGATIVE mg/dL
Ketones, ur: NEGATIVE mg/dL
Leukocytes,Ua: NEGATIVE
Nitrite: NEGATIVE
Protein, ur: NEGATIVE mg/dL
Specific Gravity, Urine: 1.017 (ref 1.005–1.030)
pH: 5 (ref 5.0–8.0)

## 2020-07-06 MED ORDER — KETOROLAC TROMETHAMINE 30 MG/ML IJ SOLN
30.0000 mg | Freq: Once | INTRAMUSCULAR | Status: AC
  Administered 2020-07-06: 30 mg via INTRAVENOUS
  Filled 2020-07-06: qty 1

## 2020-07-06 MED ORDER — KETOROLAC TROMETHAMINE 30 MG/ML IJ SOLN: 30.0000 mg | Freq: Once | INTRAMUSCULAR | Status: DC

## 2020-07-06 MED ORDER — DICLOFENAC SODIUM ER 100 MG PO TB24: 100.0000 mg | ORAL_TABLET | Freq: Every day | ORAL | 0 refills | Status: AC

## 2020-07-06 MED ORDER — ONDANSETRON HCL 4 MG/2ML IJ SOLN: 4.0000 mg | Freq: Once | INTRAMUSCULAR | Status: DC

## 2020-07-06 MED ORDER — ONDANSETRON 8 MG PO TBDP: ORAL_TABLET | ORAL | 0 refills | Status: AC

## 2020-07-06 MED ORDER — TAMSULOSIN HCL 0.4 MG PO CAPS: 0.4000 mg | ORAL_CAPSULE | Freq: Every day | ORAL | 0 refills | Status: AC

## 2020-07-06 MED ORDER — KETOROLAC TROMETHAMINE 60 MG/2ML IM SOLN: 30.0000 mg | Freq: Once | INTRAMUSCULAR | Status: DC

## 2020-07-06 MED ORDER — TAMSULOSIN HCL 0.4 MG PO CAPS: 0.4000 mg | ORAL_CAPSULE | Freq: Every day | ORAL | 0 refills | Status: DC

## 2020-07-06 MED ORDER — ONDANSETRON HCL 4 MG/2ML IJ SOLN
4.0000 mg | Freq: Once | INTRAMUSCULAR | Status: AC
  Administered 2020-07-06: 4 mg via INTRAVENOUS
  Filled 2020-07-06: qty 2

## 2020-07-06 MED ORDER — ONDANSETRON 8 MG PO TBDP: ORAL_TABLET | ORAL | 0 refills | Status: DC

## 2020-07-06 MED ORDER — TAMSULOSIN HCL 0.4 MG PO CAPS
0.4000 mg | ORAL_CAPSULE | ORAL | Status: AC
  Administered 2020-07-06: 0.4 mg via ORAL
  Filled 2020-07-06: qty 1

## 2020-07-06 MED ORDER — ONDANSETRON 8 MG PO TBDP: 8.0000 mg | ORAL_TABLET | Freq: Once | ORAL | Status: DC

## 2020-07-06 MED ORDER — DICLOFENAC SODIUM ER 100 MG PO TB24: 100.0000 mg | ORAL_TABLET | Freq: Every day | ORAL | 0 refills | Status: DC

## 2020-07-06 NOTE — ED Provider Notes (Signed)
[facility] DEPT Provider Note   CSN: [id number] Arrival date & time: 07/06/20  0008     History Chief Complaint  Patient presents with  . Flank Pain    James Harmon is a 70 y.o. male.  The history is provided by the patient.  Flank Pain This is a new problem. The current episode started more than 2 days ago (4 days ago). The problem occurs constantly. The problem has not changed since onset.Pertinent negatives include no chest pain, no abdominal pain, no headaches and no shortness of breath. Nothing aggravates the symptoms. Nothing relieves the symptoms. He has tried nothing for the symptoms. The treatment provided no relief.  Patient with HTN who does not like to take meds presents with left flank pain since Saturday.  No n/v/d.       Past Medical History:  Diagnosis Date  . Fall from ladder   . Hypertension     There are no problems to display for this patient.   Past Surgical History:  Procedure Laterality Date  . FRACTURE SURGERY  05/22/11   right wrist       History reviewed. No pertinent family history.  Social History   Tobacco Use  . Smoking status: Never Smoker  . Smokeless tobacco: Never Used  Substance Use Topics  . Alcohol use: No  . Drug use: No    Home Medications Prior to Admission medications   Not on File    Allergies    Patient has no known allergies.  Review of Systems   Review of Systems  Constitutional: Negative for fever.  HENT: Negative for congestion.   Eyes: Negative for visual disturbance.  Respiratory: Negative for shortness of breath.   Cardiovascular: Negative for chest pain.  Gastrointestinal: Negative for abdominal pain and nausea.  Genitourinary: Positive for flank pain.  Musculoskeletal: Negative for arthralgias.  Skin: Negative for color change.  Neurological: Negative for headaches.  Psychiatric/Behavioral: Negative for agitation.  All other systems reviewed and are  negative.   Physical Exam Updated Vital Signs BP 135/82   Pulse 61   Temp 98.5 F (36.9 C) (Oral)   Resp 16   Ht 5\' 9"  (1.753 m)   Wt 72.6 kg   SpO2 96%   BMI 23.63 kg/m   Physical Exam Vitals and nursing note reviewed.  Constitutional:      General: He is not in acute distress.    Appearance: Normal appearance.  HENT:     Head: Normocephalic and atraumatic.     Nose: Nose normal.  Eyes:     Conjunctiva/sclera: Conjunctivae normal.     Pupils: Pupils are equal, round, and reactive to light.  Cardiovascular:     Rate and Rhythm: Normal rate and regular rhythm.     Pulses: Normal pulses.     Heart sounds: Normal heart sounds.  Pulmonary:     Effort: Pulmonary effort is normal.     Breath sounds: Normal breath sounds.  Abdominal:     General: Abdomen is flat. Bowel sounds are normal.     Palpations: Abdomen is soft.     Tenderness: There is no abdominal tenderness. There is no guarding.  Musculoskeletal:        General: Normal range of motion.     Cervical back: Normal range of motion and neck supple.  Skin:    General: Skin is warm and dry.     Capillary Refill: Capillary refill takes less than 2 seconds.  Neurological:  General: No focal deficit present.     Mental Status: He is alert and oriented to person, place, and time.     Deep Tendon Reflexes: Reflexes normal.  Psychiatric:        Mood and Affect: Mood normal.        Behavior: Behavior normal.     ED Results / Procedures / Treatments   Labs (all labs ordered are listed, but only abnormal results are displayed) Results for orders placed or performed during the hospital encounter of 07/06/20  Urinalysis, Routine w reflex microscopic  Result Value Ref Range   Color, Urine YELLOW YELLOW   APPearance CLEAR CLEAR   Specific Gravity, Urine 1.017 1.005 - 1.030   pH 5.0 5.0 - 8.0   Glucose, UA NEGATIVE NEGATIVE mg/dL   Hgb urine dipstick SMALL (A) NEGATIVE   Bilirubin Urine NEGATIVE NEGATIVE    Ketones, ur NEGATIVE NEGATIVE mg/dL   Protein, ur NEGATIVE NEGATIVE mg/dL   Nitrite NEGATIVE NEGATIVE   Leukocytes,Ua NEGATIVE NEGATIVE   RBC / HPF 0-5 0 - 5 RBC/hpf   WBC, UA 0-5 0 - 5 WBC/hpf   Bacteria, UA NONE SEEN NONE SEEN   Mucus PRESENT    Hyaline Casts, UA PRESENT   CBC with Differential/Platelet  Result Value Ref Range   WBC 7.6 4.0 - 10.5 K/uL   RBC 4.48 4.22 - 5.81 MIL/uL   Hemoglobin 13.6 13.0 - 17.0 g/dL   HCT 40.6 39.0 - 52.0 %   MCV 90.6 80.0 - 100.0 fL   MCH 30.4 26.0 - 34.0 pg   MCHC 33.5 30.0 - 36.0 g/dL   RDW 13.8 11.5 - 15.5 %   Platelets 260 150 - 400 K/uL   nRBC 0.0 0.0 - 0.2 %   Neutrophils Relative % 74 %   Neutro Abs 5.6 1.7 - 7.7 K/uL   Lymphocytes Relative 12 %   Lymphs Abs 0.9 0.7 - 4.0 K/uL   Monocytes Relative 12 %   Monocytes Absolute 0.9 0.1 - 1.0 K/uL   Eosinophils Relative 2 %   Eosinophils Absolute 0.2 0.0 - 0.5 K/uL   Basophils Relative 0 %   Basophils Absolute 0.0 0.0 - 0.1 K/uL   Immature Granulocytes 0 %   Abs Immature Granulocytes 0.02 0.00 - 0.07 K/uL  I-stat chem 8, ED (not at [facility] or [facility])  Result Value Ref Range   Sodium 140 135 - 145 mmol/L   Potassium 3.7 3.5 - 5.1 mmol/L   Chloride 103 98 - 111 mmol/L   BUN 16 8 - 23 mg/dL   Creatinine, Ser 0.70 0.61 - 1.24 mg/dL   Glucose, Bld 107 (H) 70 - 99 mg/dL   Calcium, Ion 1.06 (L) 1.15 - 1.40 mmol/L   TCO2 25 22 - 32 mmol/L   Hemoglobin 12.9 (L) 13.0 - 17.0 g/dL   HCT 38.0 (L) 39.0 - 52.0 %   CT Renal Stone Study  Result Date: 07/06/2020 CLINICAL DATA:  Left flank pain for 4 days. Microhematuria. Hypertension. EXAM: CT ABDOMEN AND PELVIS WITHOUT CONTRAST TECHNIQUE: Multidetector CT imaging of the abdomen and pelvis was performed following the standard protocol without IV contrast. COMPARISON:  CT abdomen pelvis 08/01/05. FINDINGS: Lower chest: Bilateral lower lobe subsegmental atelectasis. 6 mm subpleural right lower lobe nodule (3:12). Hepatobiliary: No focal liver abnormality. No  gallstones, gallbladder wall thickening, or pericholecystic fluid. No biliary dilatation. Pancreas: Question distal pancreatectomy. No focal lesion. Normal pancreatic contour. No surrounding inflammatory changes. No main pancreatic ductal dilatation.  Spleen: Normal in size without focal abnormality. Adrenals/Urinary Tract: No adrenal nodule bilaterally. Mild right perinephric stranding there is a 2 mm and 4 mm consecutive calcified stone at the left ureteropelvic junction with associated proximal moderate hydroureteronephrosis. Punctate calcification noted within the left kidney. No right nephroureterolithiasis. No right hydroureteronephrosis. There is a 1.3 cm hyperdense lesion within the left kidney with a density of 62 Hounsfield units (2:38). No contour-deforming renal mass. The urinary bladder is unremarkable. Stomach/Bowel: Stomach is within normal limits. No evidence of bowel wall thickening or dilatation. Scattered colonic diverticulosis. Appendix appears normal. Vascular/Lymphatic: No abdominal aorta or iliac aneurysm. Mild atherosclerotic plaque of the aorta and its branches. No abdominal, pelvic, or inguinal lymphadenopathy. Reproductive: Prostate is unremarkable. Other: No intraperitoneal free fluid. No intraperitoneal free gas. No organized fluid collection. Musculoskeletal: No abdominal wall hernia or abnormality. No suspicious lytic or blastic osseous lesions. No acute displaced fracture. Multilevel degenerative changes of the spine. IMPRESSION: 1. Obstructive 64mm and 4 mm consecutive left ureteropelvic junction stones. 2. Nonobstructive punctate left nephrolithiasis. 3. Indeterminate 1.3 cm left renal lesion. Recommend MRI renal protocol for further evaluation. 4. A 6 mm subpleural right lower lobe nodule. Non-contrast chest CT at 6-12 months is recommended. If the nodule is stable at time of repeat CT, then future CT at 18-24 months (from today's scan) is considered optional for low-risk patients,  but is recommended for high-risk patients. This recommendation follows the consensus statement: Guidelines for Management of Incidental Pulmonary Nodules Detected on CT Images: From the Fleischner Society 2017; Radiology 2017; 284:228-243. 5. Scattered colonic diverticulosis with no acute diverticulitis. 6.  Aortic Atherosclerosis (ICD10-I70.0). Electronically Signed   By: Iven Finn M.D.   On: 07/06/2020 02:45    Radiology CT Renal Stone Study  Result Date: 07/06/2020 CLINICAL DATA:  Left flank pain for 4 days. Microhematuria. Hypertension. EXAM: CT ABDOMEN AND PELVIS WITHOUT CONTRAST TECHNIQUE: Multidetector CT imaging of the abdomen and pelvis was performed following the standard protocol without IV contrast. COMPARISON:  CT abdomen pelvis 08/01/05. FINDINGS: Lower chest: Bilateral lower lobe subsegmental atelectasis. 6 mm subpleural right lower lobe nodule (3:12). Hepatobiliary: No focal liver abnormality. No gallstones, gallbladder wall thickening, or pericholecystic fluid. No biliary dilatation. Pancreas: Question distal pancreatectomy. No focal lesion. Normal pancreatic contour. No surrounding inflammatory changes. No main pancreatic ductal dilatation. Spleen: Normal in size without focal abnormality. Adrenals/Urinary Tract: No adrenal nodule bilaterally. Mild right perinephric stranding there is a 2 mm and 4 mm consecutive calcified stone at the left ureteropelvic junction with associated proximal moderate hydroureteronephrosis. Punctate calcification noted within the left kidney. No right nephroureterolithiasis. No right hydroureteronephrosis. There is a 1.3 cm hyperdense lesion within the left kidney with a density of 62 Hounsfield units (2:38). No contour-deforming renal mass. The urinary bladder is unremarkable. Stomach/Bowel: Stomach is within normal limits. No evidence of bowel wall thickening or dilatation. Scattered colonic diverticulosis. Appendix appears normal. Vascular/Lymphatic: No  abdominal aorta or iliac aneurysm. Mild atherosclerotic plaque of the aorta and its branches. No abdominal, pelvic, or inguinal lymphadenopathy. Reproductive: Prostate is unremarkable. Other: No intraperitoneal free fluid. No intraperitoneal free gas. No organized fluid collection. Musculoskeletal: No abdominal wall hernia or abnormality. No suspicious lytic or blastic osseous lesions. No acute displaced fracture. Multilevel degenerative changes of the spine. IMPRESSION: 1. Obstructive 107mm and 4 mm consecutive left ureteropelvic junction stones. 2. Nonobstructive punctate left nephrolithiasis. 3. Indeterminate 1.3 cm left renal lesion. Recommend MRI renal protocol for further evaluation. 4. A 6 mm subpleural right lower  lobe nodule. Non-contrast chest CT at 6-12 months is recommended. If the nodule is stable at time of repeat CT, then future CT at 18-24 months (from today's scan) is considered optional for low-risk patients, but is recommended for high-risk patients. This recommendation follows the consensus statement: Guidelines for Management of Incidental Pulmonary Nodules Detected on CT Images: From the Fleischner Society 2017; Radiology 2017; 284:228-243. 5. Scattered colonic diverticulosis with no acute diverticulitis. 6.  Aortic Atherosclerosis (ICD10-I70.0). Electronically Signed   By: Iven Finn M.D.   On: 07/06/2020 02:45    Procedures Procedures   Medications Ordered in ED Medications  tamsulosin (FLOMAX) capsule 0.4 mg (0.4 mg Oral Given 07/06/20 0320)  ketorolac (TORADOL) 30 MG/ML injection 30 mg (30 mg Intravenous Given 07/06/20 0320)  ondansetron (ZOFRAN) injection 4 mg (4 mg Intravenous Given 07/06/20 0320)    ED Course  I have reviewed the triage vital signs and the nursing notes.  Pertinent labs & imaging results that were available during my care of the patient were reviewed by me and considered in my medical decision making (see chart for details).    Patient and wife  verbally and in written form on AVS informed of subpleural nodule and need to have outpatient chest CT scan within 6 months to assess stability.  Patient and wife also verbally and in written form informed of left 1.3 cm renal mass and need for abdominal MRI to characterize this lesion.  Both verbalize understanding and agree to follow up for these outpatient tests.    Patient informed of 2 stones in the distal left ureter and need to take all medication as directed and to strain all urine and call for follow up with urology.  They verbalize understanding and agree to follow up.   Karen Chafe Chapel was evaluated in Emergency Department on 07/06/2020 for the symptoms described in the history of present illness. He was evaluated in the context of the global COVID-19 pandemic, which necessitated consideration that the patient might be at risk for infection with the SARS-CoV-2 virus that causes COVID-19. Institutional protocols and algorithms that pertain to the evaluation of patients at risk for COVID-19 are in a state of rapid change based on information released by regulatory bodies including the CDC and federal and state organizations. These policies and algorithms were followed during the patient's care in the ED.  Final Clinical Impression(s) / ED Diagnoses Final diagnoses:  Kidney stone  Renal mass, left  Pleural nodule   Return for intractable cough, coughing up blood, fevers >100.4 unrelieved by medication, shortness of breath, intractable vomiting, chest pain, shortness of breath, weakness, numbness, changes in speech, facial asymmetry, abdominal pain, passing out, Inability to tolerate liquids or food, cough, altered mental status or any concerns. No signs of systemic illness or infection. The patient is nontoxic-appearing on exam and vital signs are within normal limits.  I have reviewed the triage vital signs and the nursing notes. Pertinent labs & imaging results that were available during my care  of the patient were reviewed by me and considered in my medical decision making (see chart for details). After history, exam, and medical workup I feel the patient has been appropriately medically screened and is safe for discharge home. Pertinent diagnoses were discussed with the patient. Patient was given return precautions.    Decorey Wahlert, MD 07/06/20 [phone]

## 2020-07-06 NOTE — ED Triage Notes (Signed)
Left flank pain x 4 days.

## 2020-08-21 DIAGNOSIS — R911 Solitary pulmonary nodule: Secondary | ICD-10-CM | POA: Diagnosis not present

## 2020-08-21 DIAGNOSIS — N289 Disorder of kidney and ureter, unspecified: Secondary | ICD-10-CM | POA: Diagnosis not present

## 2020-08-21 DIAGNOSIS — H9193 Unspecified hearing loss, bilateral: Secondary | ICD-10-CM | POA: Diagnosis not present

## 2020-08-21 DIAGNOSIS — L989 Disorder of the skin and subcutaneous tissue, unspecified: Secondary | ICD-10-CM | POA: Diagnosis not present

## 2020-08-21 DIAGNOSIS — N200 Calculus of kidney: Secondary | ICD-10-CM | POA: Diagnosis not present

## 2020-08-25 ENCOUNTER — Other Ambulatory Visit: Payer: Self-pay | Admitting: Family Medicine

## 2020-08-25 DIAGNOSIS — N289 Disorder of kidney and ureter, unspecified: Secondary | ICD-10-CM

## 2020-09-07 ENCOUNTER — Other Ambulatory Visit: Payer: Self-pay

## 2020-09-07 ENCOUNTER — Ambulatory Visit
Admission: RE | Admit: 2020-09-07 | Discharge: 2020-09-07 | Disposition: A | Discharge status: Home / Self Care | Payer: Medicare Other | Source: Ambulatory Visit | Attending: Family Medicine | Admitting: Family Medicine

## 2020-09-07 DIAGNOSIS — N289 Disorder of kidney and ureter, unspecified: Secondary | ICD-10-CM

## 2020-09-07 DIAGNOSIS — D1803 Hemangioma of intra-abdominal structures: Secondary | ICD-10-CM | POA: Diagnosis not present

## 2020-09-07 DIAGNOSIS — N281 Cyst of kidney, acquired: Secondary | ICD-10-CM | POA: Diagnosis not present

## 2020-09-07 DIAGNOSIS — Q453 Other congenital malformations of pancreas and pancreatic duct: Secondary | ICD-10-CM | POA: Diagnosis not present

## 2020-09-07 MED ORDER — GADOBENATE DIMEGLUMINE 529 MG/ML IV SOLN
14.0000 mL | Freq: Once | INTRAVENOUS | Status: AC | PRN
  Administered 2020-09-07: 14 mL via INTRAVENOUS

## 2021-05-23 DIAGNOSIS — Z79899 Other long term (current) drug therapy: Secondary | ICD-10-CM | POA: Diagnosis not present

## 2021-05-23 DIAGNOSIS — Z136 Encounter for screening for cardiovascular disorders: Secondary | ICD-10-CM | POA: Diagnosis not present

## 2021-05-23 DIAGNOSIS — Z0000 Encounter for general adult medical examination without abnormal findings: Secondary | ICD-10-CM | POA: Diagnosis not present

## 2021-05-25 DIAGNOSIS — Z79899 Other long term (current) drug therapy: Secondary | ICD-10-CM | POA: Diagnosis not present

## 2021-05-25 DIAGNOSIS — I10 Essential (primary) hypertension: Secondary | ICD-10-CM | POA: Diagnosis not present

## 2021-05-25 DIAGNOSIS — Z23 Encounter for immunization: Secondary | ICD-10-CM | POA: Diagnosis not present

## 2021-05-25 DIAGNOSIS — L989 Disorder of the skin and subcutaneous tissue, unspecified: Secondary | ICD-10-CM | POA: Diagnosis not present

## 2021-05-25 DIAGNOSIS — Z0000 Encounter for general adult medical examination without abnormal findings: Secondary | ICD-10-CM | POA: Diagnosis not present

## 2021-05-25 DIAGNOSIS — R911 Solitary pulmonary nodule: Secondary | ICD-10-CM | POA: Diagnosis not present

## 2021-05-31 ENCOUNTER — Other Ambulatory Visit: Payer: Self-pay | Admitting: Family Medicine

## 2021-05-31 DIAGNOSIS — R911 Solitary pulmonary nodule: Secondary | ICD-10-CM

## 2021-06-29 ENCOUNTER — Ambulatory Visit
Admission: RE | Admit: 2021-06-29 | Discharge: 2021-06-29 | Disposition: A | Discharge status: Home / Self Care | Payer: Medicare Other | Source: Ambulatory Visit | Attending: Family Medicine | Admitting: Family Medicine

## 2021-06-29 DIAGNOSIS — R918 Other nonspecific abnormal finding of lung field: Secondary | ICD-10-CM | POA: Diagnosis not present

## 2021-06-29 DIAGNOSIS — R911 Solitary pulmonary nodule: Secondary | ICD-10-CM | POA: Diagnosis not present

## 2021-07-16 DIAGNOSIS — I10 Essential (primary) hypertension: Secondary | ICD-10-CM | POA: Diagnosis not present

## 2021-07-18 DIAGNOSIS — Z0000 Encounter for general adult medical examination without abnormal findings: Secondary | ICD-10-CM | POA: Diagnosis not present

## 2021-07-26 DIAGNOSIS — D485 Neoplasm of uncertain behavior of skin: Secondary | ICD-10-CM | POA: Diagnosis not present

## 2021-07-26 DIAGNOSIS — L821 Other seborrheic keratosis: Secondary | ICD-10-CM | POA: Diagnosis not present

## 2021-07-26 DIAGNOSIS — C44319 Basal cell carcinoma of skin of other parts of face: Secondary | ICD-10-CM | POA: Diagnosis not present

## 2021-08-14 DIAGNOSIS — L570 Actinic keratosis: Secondary | ICD-10-CM | POA: Diagnosis not present

## 2021-08-14 DIAGNOSIS — D1801 Hemangioma of skin and subcutaneous tissue: Secondary | ICD-10-CM | POA: Diagnosis not present

## 2021-08-14 DIAGNOSIS — L821 Other seborrheic keratosis: Secondary | ICD-10-CM | POA: Diagnosis not present

## 2021-08-14 DIAGNOSIS — L814 Other melanin hyperpigmentation: Secondary | ICD-10-CM | POA: Diagnosis not present

## 2021-09-09 IMAGING — CT CT RENAL STONE PROTOCOL
2 of 4 series · 15 of 46 positions shown, 17 images · non-contrast
Comparison: CT abdomen pelvis 08/01/05.

CLINICAL DATA: Left flank pain for 4 days. Microhematuria.
Hypertension.

EXAM:
CT ABDOMEN AND PELVIS WITHOUT CONTRAST
TECHNIQUE: Multidetector CT imaging of the abdomen and pelvis was performed
following the standard protocol without IV contrast.

[Series 2: axial st · axial · 0.73mm/px · z∈[+1079,+1454]mm · 12 of 85 slices shown, 14 images]
[im 5/85  soft-tissue]
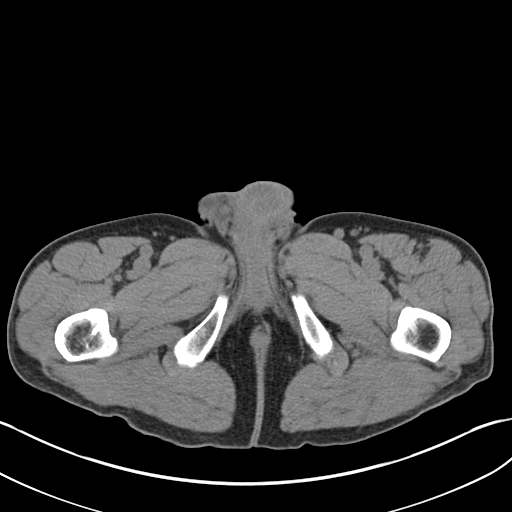
[im 5/85  bone]
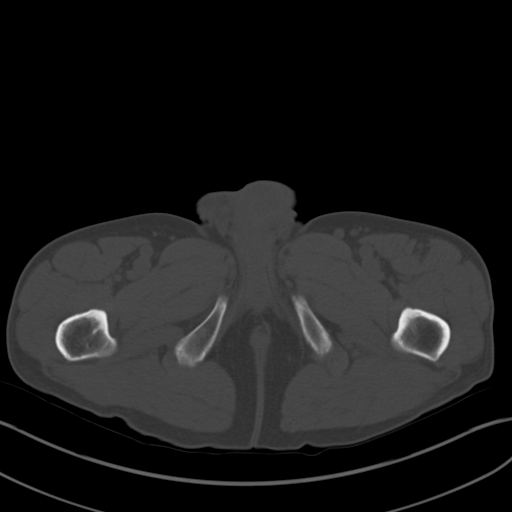
[im 14/85  soft-tissue]
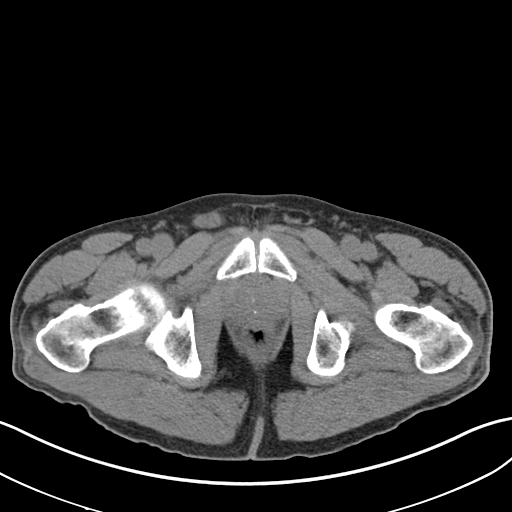
[im 18/85  soft-tissue]
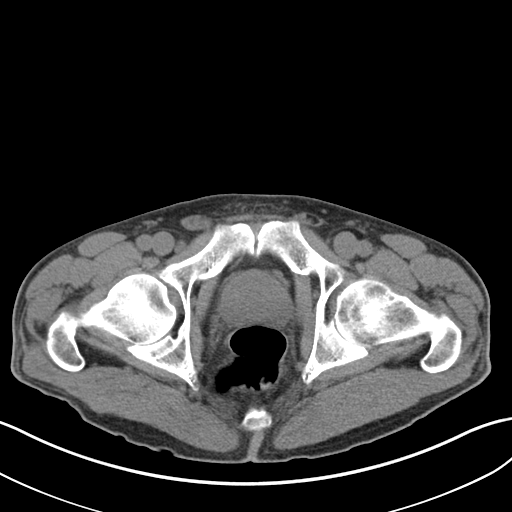
[im 27/85  soft-tissue]
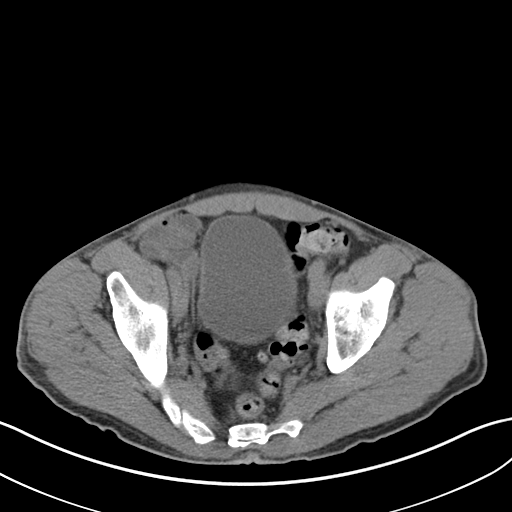
[im 31/85  soft-tissue]
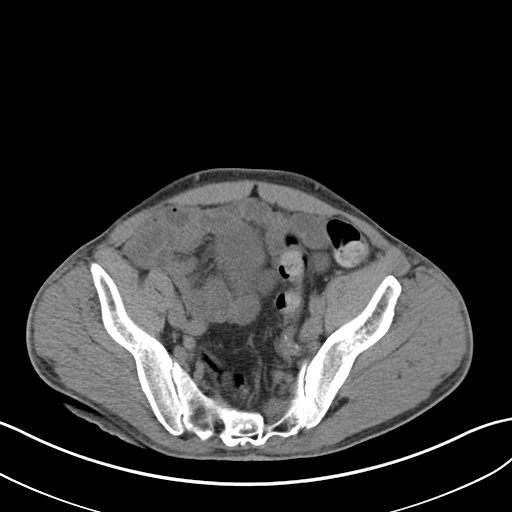
[im 40/85  soft-tissue]
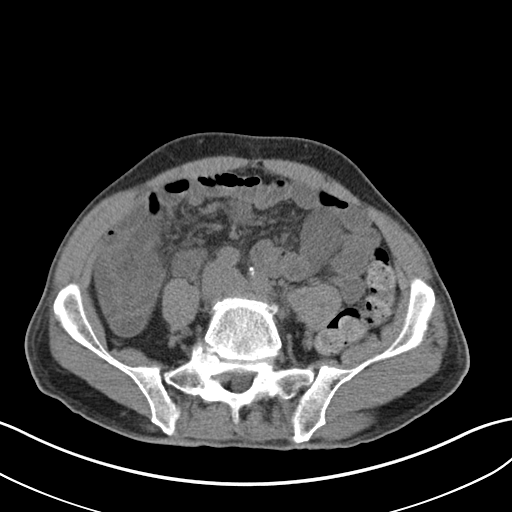
[im 45/85  soft-tissue]
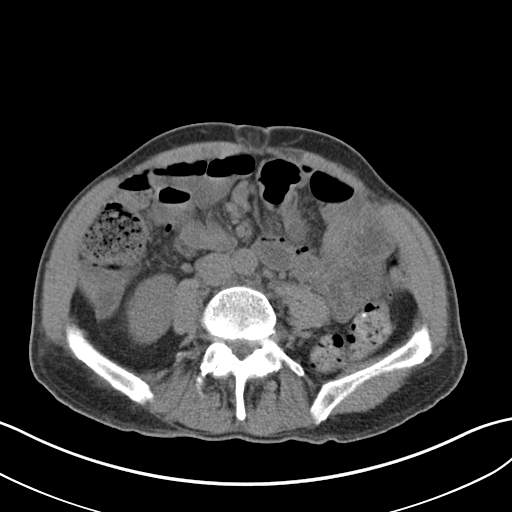
[im 54/85  soft-tissue]
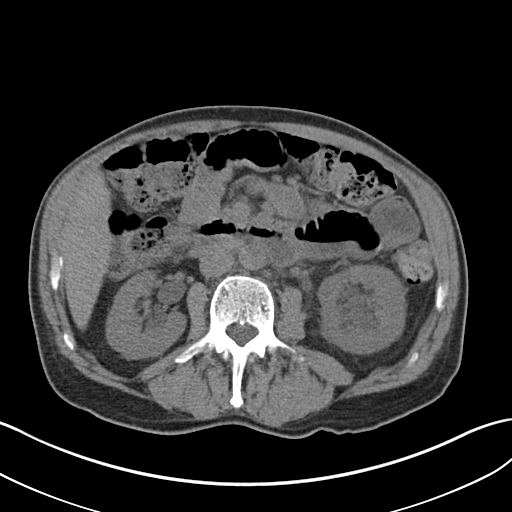
[im 58/85  soft-tissue]
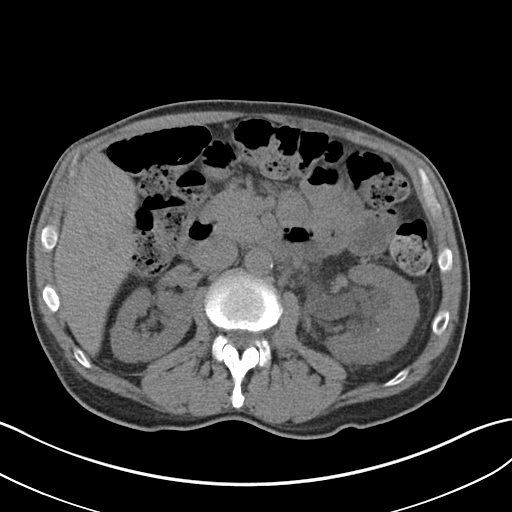
[im 58/85  bone]
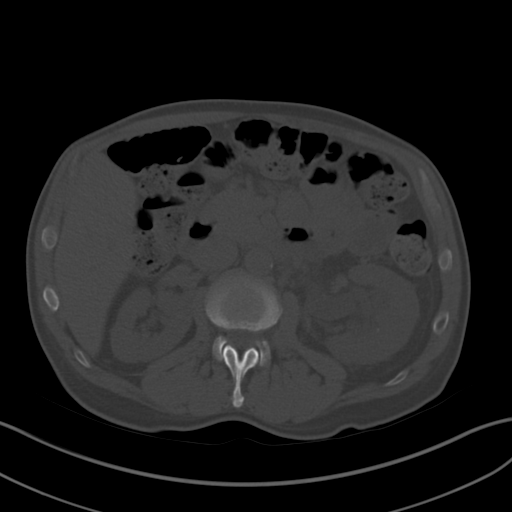
[im 67/85  soft-tissue]
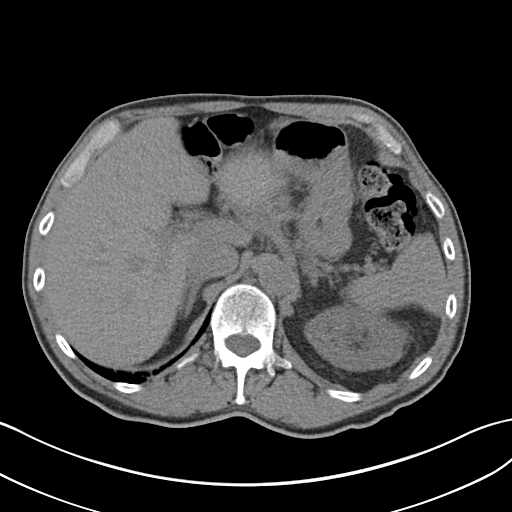
[im 71/85  soft-tissue]
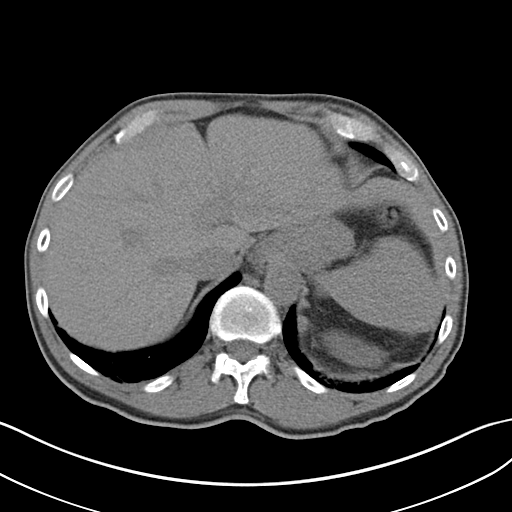
[im 80/85  soft-tissue]
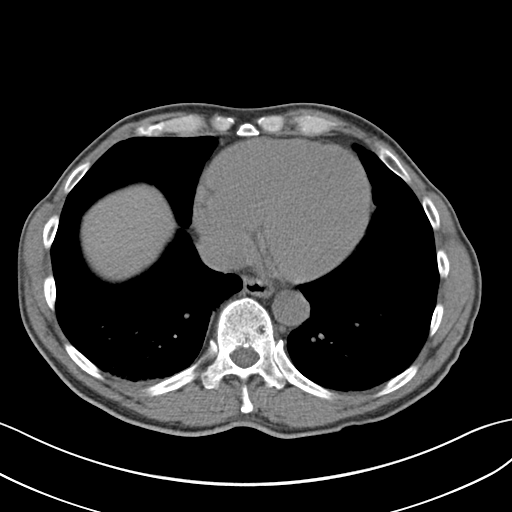

[Series 4: coronal · coronal · 0.65mm/px · 3 of 133 slices shown]
[im 45/133  soft-tissue]
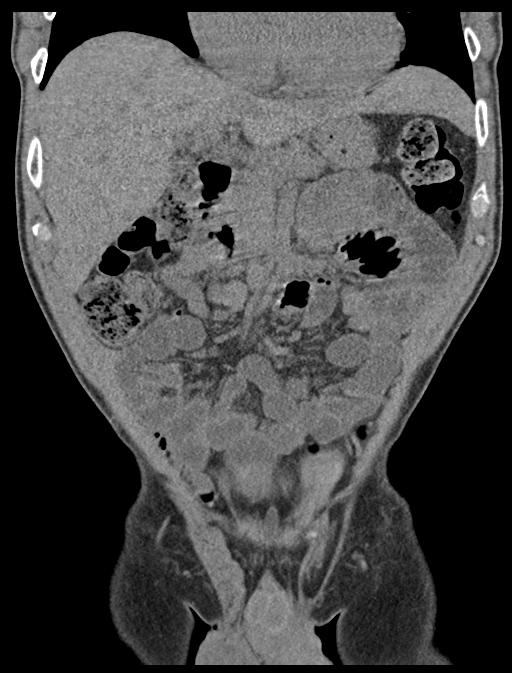
[im 59/133  soft-tissue]
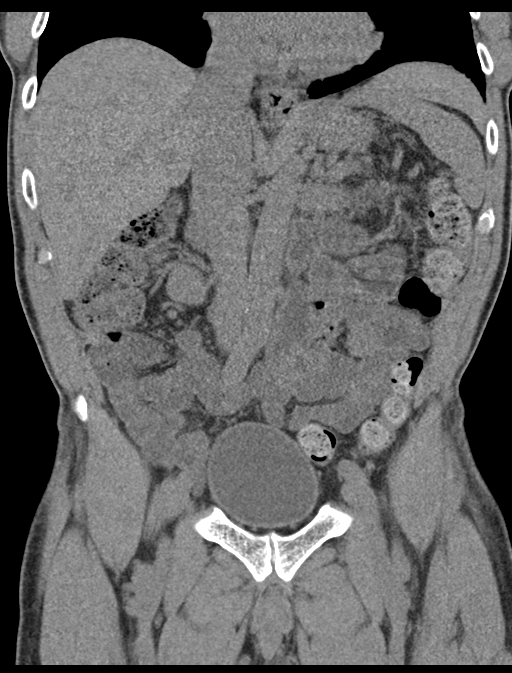
[im 74/133  soft-tissue]
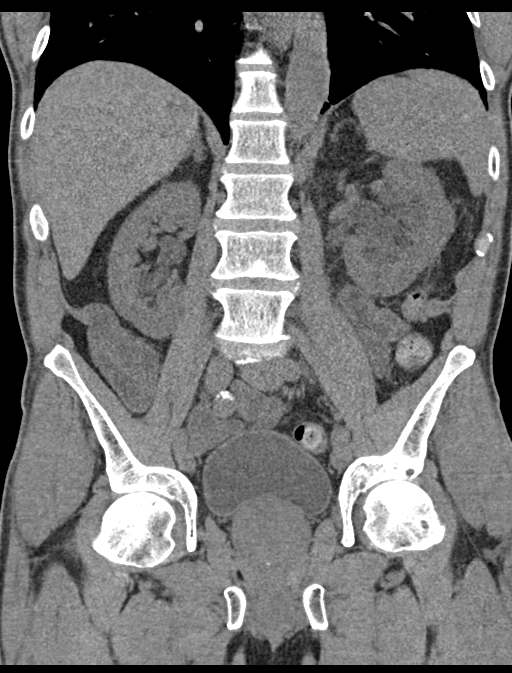

[15 of 46 positions shown; findings below may reference images not displayed]

FINDINGS: Lower chest: Bilateral lower lobe subsegmental atelectasis. 6 mm
subpleural right lower lobe nodule ([DATE]).

Hepatobiliary: No focal liver abnormality. No gallstones,
gallbladder wall thickening, or pericholecystic fluid. No biliary
dilatation.

Pancreas: Question distal pancreatectomy. No focal lesion. Normal
pancreatic contour. No surrounding inflammatory changes. No main
pancreatic ductal dilatation.

Spleen: Normal in size without focal abnormality.

Adrenals/Urinary Tract:

No adrenal nodule bilaterally.

Mild right perinephric stranding there is a 2 mm and 4 mm
consecutive calcified stone at the left ureteropelvic junction with
associated proximal moderate hydroureteronephrosis. Punctate
calcification noted within the left kidney. No right
nephroureterolithiasis. No right hydroureteronephrosis. There is a
1.3 cm hyperdense lesion within the left kidney with a density of 62
Hounsfield units ([DATE]). No contour-deforming renal mass.

The urinary bladder is unremarkable.

Stomach/Bowel: Stomach is within normal limits. No evidence of bowel
wall thickening or dilatation. Scattered colonic diverticulosis.
Appendix appears normal.

Vascular/Lymphatic: No abdominal aorta or iliac aneurysm. Mild
atherosclerotic plaque of the aorta and its branches. No abdominal,
pelvic, or inguinal lymphadenopathy.

Reproductive: Prostate is unremarkable.

Other: No intraperitoneal free fluid. No intraperitoneal free gas.
No organized fluid collection.

Musculoskeletal:

No abdominal wall hernia or abnormality.

No suspicious lytic or blastic osseous lesions. No acute displaced
fracture. Multilevel degenerative changes of the spine.
IMPRESSION: 1. Obstructive 2mm and 4 mm consecutive left ureteropelvic junction
stones.
2. Nonobstructive punctate left nephrolithiasis.
3. Indeterminate 1.3 cm left renal lesion. Recommend MRI renal
protocol for further evaluation.
4. A 6 mm subpleural right lower lobe nodule. Non-contrast chest CT
at 6-12 months is recommended. If the nodule is stable at time of
repeat CT, then future CT at 18-24 months (from today's scan) is
considered optional for low-risk patients, but is recommended for
high-risk patients. This recommendation follows the consensus
statement: Guidelines for Management of Incidental Pulmonary Nodules
Detected on CT Images: From the [HOSPITAL] 6733; Radiology
5. Scattered colonic diverticulosis with no acute diverticulitis.
6.  Aortic Atherosclerosis ([id number]).

## 2021-10-25 DIAGNOSIS — C44319 Basal cell carcinoma of skin of other parts of face: Secondary | ICD-10-CM | POA: Diagnosis not present

## 2022-02-14 DIAGNOSIS — L821 Other seborrheic keratosis: Secondary | ICD-10-CM | POA: Diagnosis not present

## 2022-02-14 DIAGNOSIS — Z85828 Personal history of other malignant neoplasm of skin: Secondary | ICD-10-CM | POA: Diagnosis not present

## 2022-02-14 DIAGNOSIS — L814 Other melanin hyperpigmentation: Secondary | ICD-10-CM | POA: Diagnosis not present

## 2022-02-14 DIAGNOSIS — Z08 Encounter for follow-up examination after completed treatment for malignant neoplasm: Secondary | ICD-10-CM | POA: Diagnosis not present

## 2022-02-14 DIAGNOSIS — D1801 Hemangioma of skin and subcutaneous tissue: Secondary | ICD-10-CM | POA: Diagnosis not present

## 2022-08-21 DIAGNOSIS — Z85828 Personal history of other malignant neoplasm of skin: Secondary | ICD-10-CM | POA: Diagnosis not present

## 2022-08-21 DIAGNOSIS — L814 Other melanin hyperpigmentation: Secondary | ICD-10-CM | POA: Diagnosis not present

## 2022-08-21 DIAGNOSIS — Z08 Encounter for follow-up examination after completed treatment for malignant neoplasm: Secondary | ICD-10-CM | POA: Diagnosis not present

## 2022-08-21 DIAGNOSIS — D225 Melanocytic nevi of trunk: Secondary | ICD-10-CM | POA: Diagnosis not present

## 2022-08-21 DIAGNOSIS — L821 Other seborrheic keratosis: Secondary | ICD-10-CM | POA: Diagnosis not present

## 2022-09-02 IMAGING — CT CT CHEST W/O CM
2 of 5 series · 15 of 36 positions shown, 18 images · non-contrast
Comparison: Chest radiograph dated 05/21/2011. CT abdomen pelvis
dated 07/06/2020.

CLINICAL DATA: Follow-up lung nodule.



[Series 4: chest 2.00 br40 s3 · coronal · 0.68mm/px · 3 of 143 slices shown]
[im 29/143  lung]
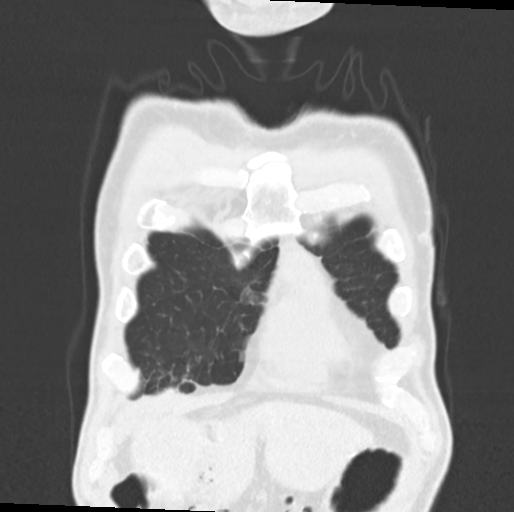
[im 57/143  lung]
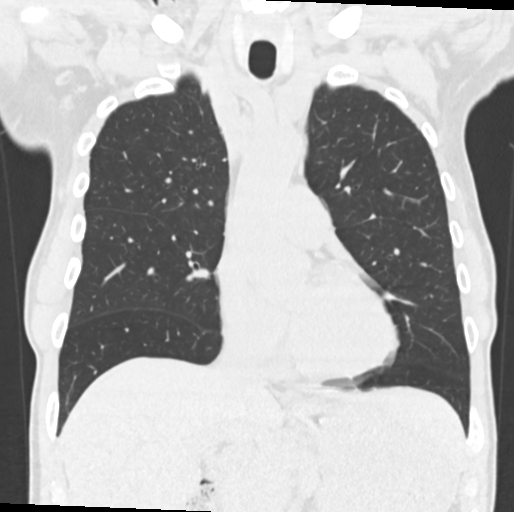
[im 86/143  lung]
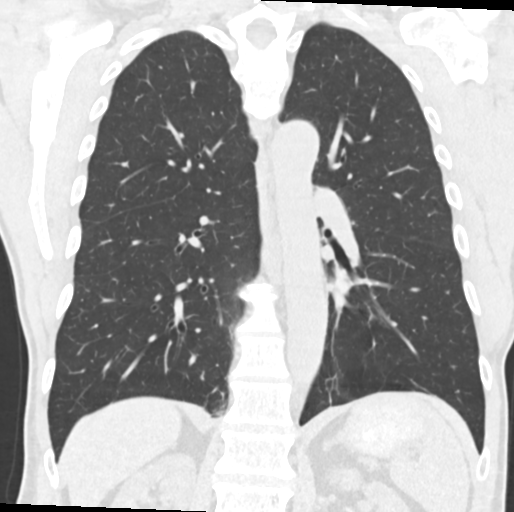

[Series 10: chest 1.00 br40 s3 super d · axial · 0.68mm/px · z∈[+1464,+1764]mm · 12 of 433 slices shown, 15 images]
[im 29/433  mediastinal]
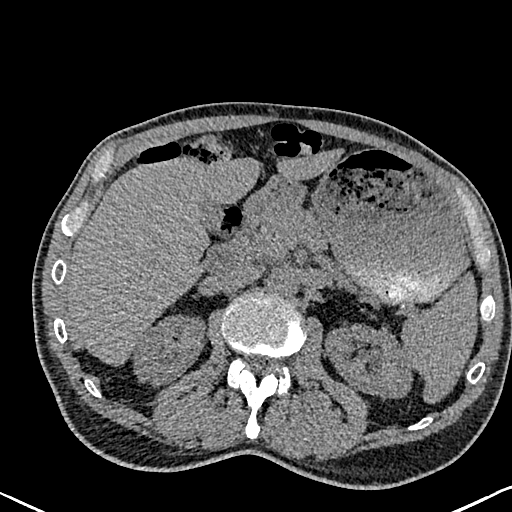
[im 29/433  lung]
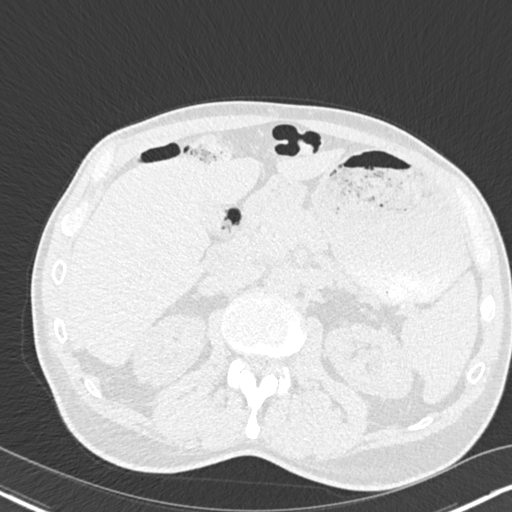
[im 58/433  lung]
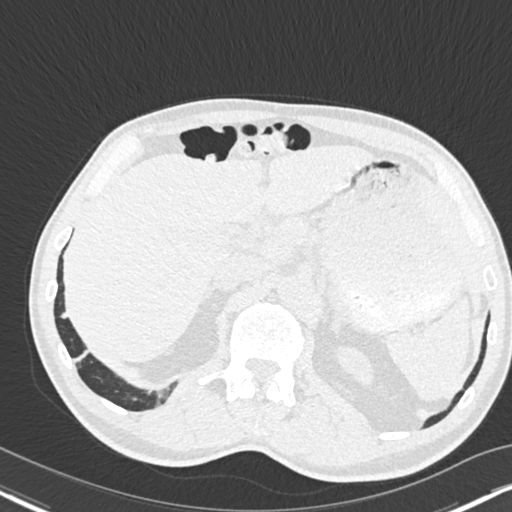
[im 87/433  lung]
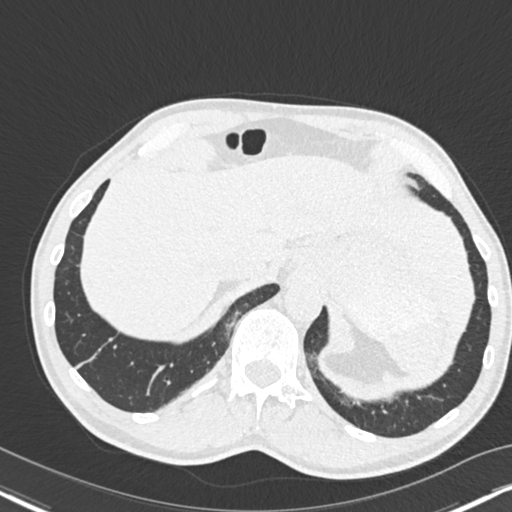
[im 145/433  lung]
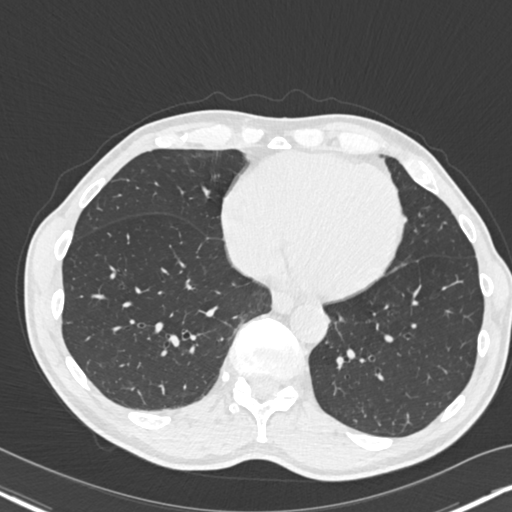
[im 173/433  mediastinal]
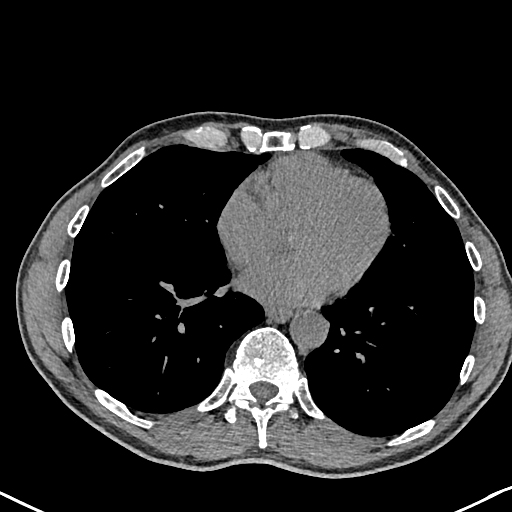
[im 173/433  lung]
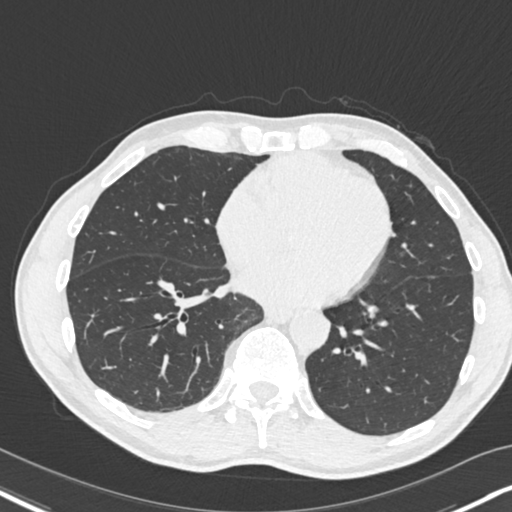
[im 202/433  lung]
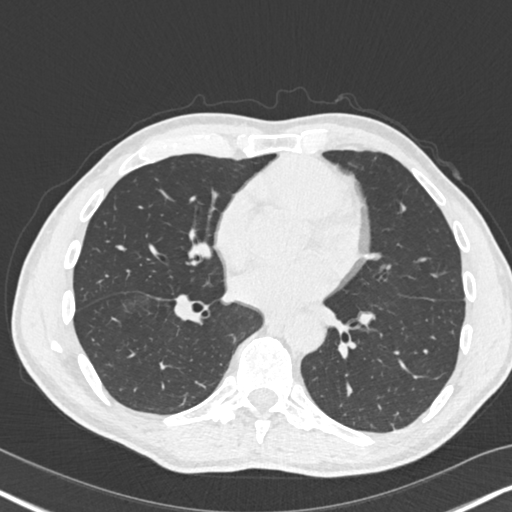
[im 231/433  lung]
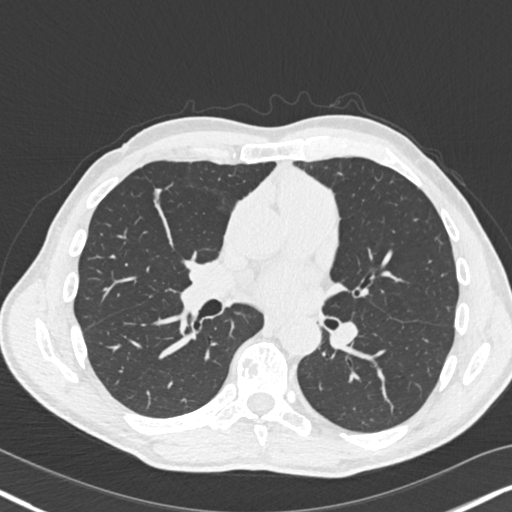
[im 260/433  lung]
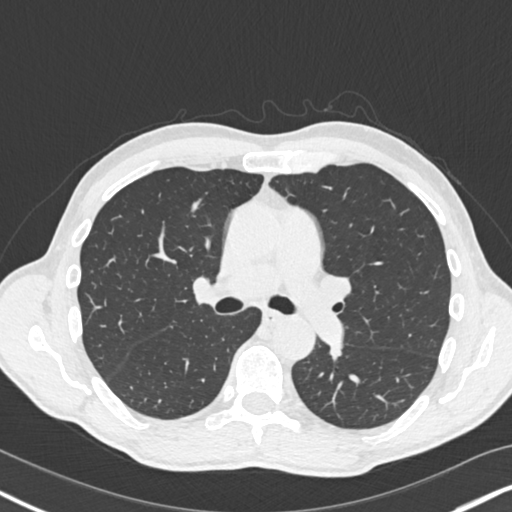
[im 289/433  mediastinal]
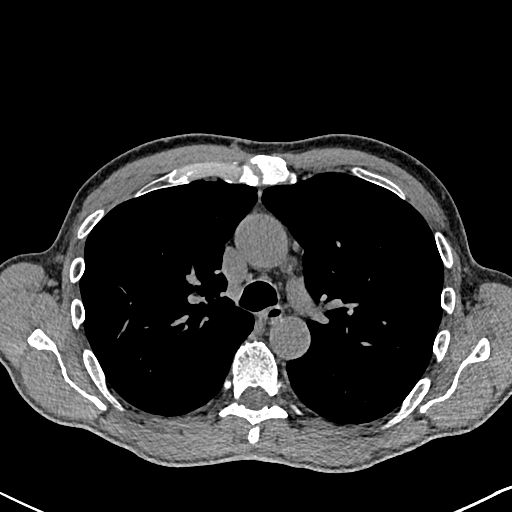
[im 289/433  lung]
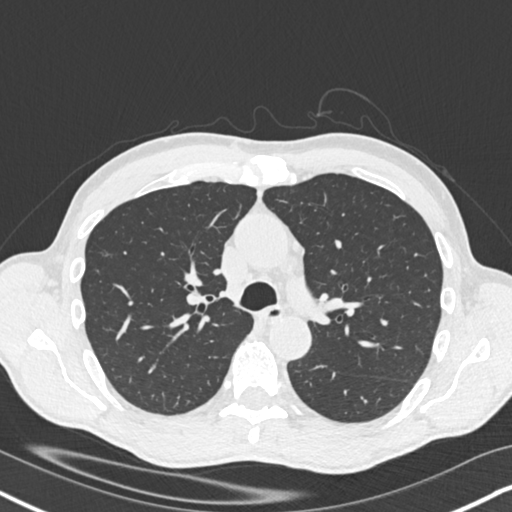
[im 346/433  lung]
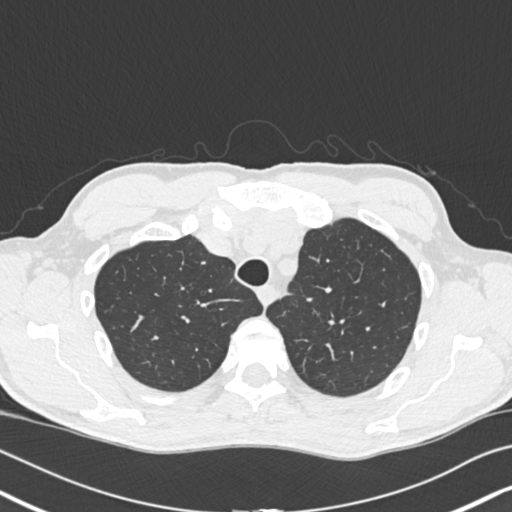
[im 375/433  lung]
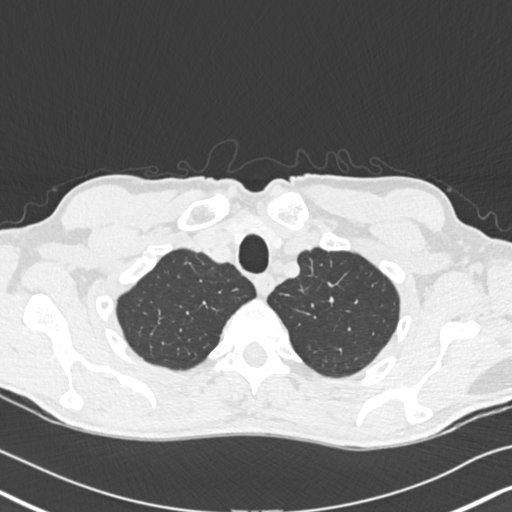
[im 404/433  lung]
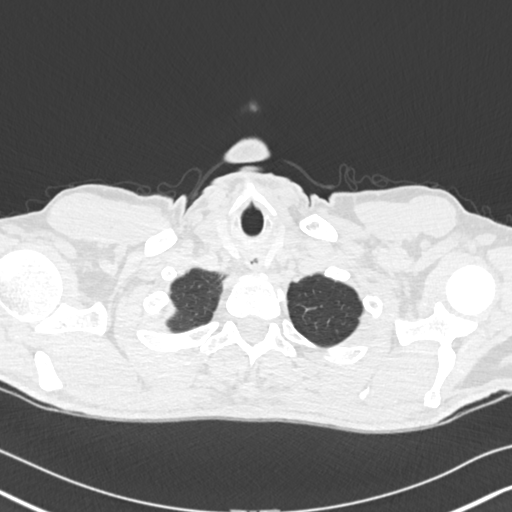

[15 of 36 positions shown; findings below may reference images not displayed]

FINDINGS: Evaluation of this exam is limited in the absence of intravenous
contrast.

Cardiovascular: There is no cardiomegaly or pericardial effusion.
Coronary vascular calcification of the LAD. The thoracic aorta and
the central pulmonary arteries are grossly unremarkable on this
noncontrast CT.

Mediastinum/Nodes: No hilar or mediastinal adenopathy. The esophagus
and the thyroid gland are grossly unremarkable. No mediastinal fluid
collection.

Lungs/Pleura: A 5 mm subpleural nodule at the right lung base
appears similar to prior CT. Additional 5 mm nodule in the right
lower lobe (93/8) noted. There is a 4 mm nodule along the right
minor fissure (80/8). No focal consolidation, pleural effusion, or
pneumothorax. The central airways are patent.

Upper Abdomen: No acute abnormality.

Musculoskeletal: Osteopenia with degenerative changes of the spine.
No acute osseous pathology.
IMPRESSION: 1. No acute intrathoracic pathology.
2. Several pulmonary nodules measure up to 5 mm. The right lung base
pulmonary nodule is similar to prior CT. No follow-up needed if
patient is low-risk (and has no known or suspected primary
neoplasm). Non-contrast chest CT can be considered in 12 months if
patient is high-risk. This recommendation follows the consensus
statement: Guidelines for Management of Incidental Pulmonary Nodules
Detected on CT Images: From the [HOSPITAL] 3454; Radiology

## 2023-02-03 DIAGNOSIS — R1319 Other dysphagia: Secondary | ICD-10-CM | POA: Diagnosis not present

## 2023-02-03 DIAGNOSIS — Z79899 Other long term (current) drug therapy: Secondary | ICD-10-CM | POA: Diagnosis not present

## 2023-02-03 DIAGNOSIS — Z0000 Encounter for general adult medical examination without abnormal findings: Secondary | ICD-10-CM | POA: Diagnosis not present

## 2023-02-03 DIAGNOSIS — H9193 Unspecified hearing loss, bilateral: Secondary | ICD-10-CM | POA: Diagnosis not present

## 2023-02-03 DIAGNOSIS — I10 Essential (primary) hypertension: Secondary | ICD-10-CM | POA: Diagnosis not present

## 2023-02-04 ENCOUNTER — Other Ambulatory Visit: Payer: Self-pay | Admitting: Family Medicine

## 2023-02-04 DIAGNOSIS — R1319 Other dysphagia: Secondary | ICD-10-CM

## 2023-02-05 DIAGNOSIS — H52223 Regular astigmatism, bilateral: Secondary | ICD-10-CM | POA: Diagnosis not present

## 2023-02-05 DIAGNOSIS — H2513 Age-related nuclear cataract, bilateral: Secondary | ICD-10-CM | POA: Diagnosis not present

## 2023-02-05 DIAGNOSIS — H40023 Open angle with borderline findings, high risk, bilateral: Secondary | ICD-10-CM | POA: Diagnosis not present

## 2023-02-05 DIAGNOSIS — H5203 Hypermetropia, bilateral: Secondary | ICD-10-CM | POA: Diagnosis not present

## 2023-02-05 DIAGNOSIS — H524 Presbyopia: Secondary | ICD-10-CM | POA: Diagnosis not present

## 2023-02-14 ENCOUNTER — Ambulatory Visit
Admission: RE | Admit: 2023-02-14 | Discharge: 2023-02-14 | Disposition: A | Discharge status: Home / Self Care | Payer: Medicare Other | Source: Ambulatory Visit | Attending: Family Medicine | Admitting: Family Medicine

## 2023-02-14 DIAGNOSIS — R1319 Other dysphagia: Secondary | ICD-10-CM

## 2023-02-14 DIAGNOSIS — K224 Dyskinesia of esophagus: Secondary | ICD-10-CM | POA: Diagnosis not present

## 2023-02-14 DIAGNOSIS — K449 Diaphragmatic hernia without obstruction or gangrene: Secondary | ICD-10-CM | POA: Diagnosis not present

## 2023-02-14 DIAGNOSIS — R1310 Dysphagia, unspecified: Secondary | ICD-10-CM | POA: Diagnosis not present

## 2023-08-07 DIAGNOSIS — H2513 Age-related nuclear cataract, bilateral: Secondary | ICD-10-CM | POA: Diagnosis not present

## 2023-08-07 DIAGNOSIS — H40023 Open angle with borderline findings, high risk, bilateral: Secondary | ICD-10-CM | POA: Diagnosis not present

## 2023-08-11 DIAGNOSIS — E7800 Pure hypercholesterolemia, unspecified: Secondary | ICD-10-CM | POA: Diagnosis not present

## 2023-08-11 DIAGNOSIS — Z974 Presence of external hearing-aid: Secondary | ICD-10-CM | POA: Diagnosis not present

## 2023-08-11 DIAGNOSIS — K449 Diaphragmatic hernia without obstruction or gangrene: Secondary | ICD-10-CM | POA: Diagnosis not present

## 2023-08-11 DIAGNOSIS — I10 Essential (primary) hypertension: Secondary | ICD-10-CM | POA: Diagnosis not present
